# Patient Record
Sex: Female | Born: 1980 | Race: White | Hispanic: No | Marital: Married | Smoking: Never smoker
Health system: Southern US, Community
[De-identification: ages and names within clinical notes are randomized; demographics above are authoritative.]

## PROBLEM LIST (undated history)

## (undated) ENCOUNTER — Ambulatory Visit: Payer: 59

## (undated) DIAGNOSIS — E282 Polycystic ovarian syndrome: Secondary | ICD-10-CM

## (undated) DIAGNOSIS — K760 Fatty (change of) liver, not elsewhere classified: Secondary | ICD-10-CM

## (undated) DIAGNOSIS — F32A Depression, unspecified: Secondary | ICD-10-CM

## (undated) DIAGNOSIS — T7840XA Allergy, unspecified, initial encounter: Secondary | ICD-10-CM

## (undated) DIAGNOSIS — E785 Hyperlipidemia, unspecified: Secondary | ICD-10-CM

## (undated) DIAGNOSIS — E538 Deficiency of other specified B group vitamins: Secondary | ICD-10-CM

## (undated) DIAGNOSIS — E559 Vitamin D deficiency, unspecified: Secondary | ICD-10-CM

## (undated) DIAGNOSIS — I10 Essential (primary) hypertension: Secondary | ICD-10-CM

## (undated) DIAGNOSIS — E119 Type 2 diabetes mellitus without complications: Secondary | ICD-10-CM

## (undated) DIAGNOSIS — M2550 Pain in unspecified joint: Secondary | ICD-10-CM

## (undated) HISTORY — DX: Hyperlipidemia, unspecified: E78.5

## (undated) HISTORY — DX: Depression, unspecified: F32.A

## (undated) HISTORY — DX: Deficiency of other specified B group vitamins: E53.8

## (undated) HISTORY — DX: Type 2 diabetes mellitus without complications: E11.9

## (undated) HISTORY — DX: Polycystic ovarian syndrome: E28.2

## (undated) HISTORY — DX: Fatty (change of) liver, not elsewhere classified: K76.0

## (undated) HISTORY — DX: Pain in unspecified joint: M25.50

## (undated) HISTORY — DX: Allergy, unspecified, initial encounter: T78.40XA

## (undated) HISTORY — DX: Vitamin D deficiency, unspecified: E55.9

---

## 1998-08-24 HISTORY — PX: FOOT SURGERY: SHX648

## 2006-11-29 ENCOUNTER — Encounter: Payer: Self-pay | Admitting: Maternal & Fetal Medicine

## 2007-01-03 ENCOUNTER — Observation Stay: Payer: Self-pay

## 2007-02-17 ENCOUNTER — Inpatient Hospital Stay: Payer: Self-pay

## 2007-02-21 ENCOUNTER — Inpatient Hospital Stay: Payer: Self-pay

## 2007-03-07 ENCOUNTER — Ambulatory Visit: Payer: Self-pay | Admitting: Pediatrics

## 2007-09-26 ENCOUNTER — Ambulatory Visit: Payer: Self-pay | Admitting: Family Medicine

## 2007-11-04 ENCOUNTER — Ambulatory Visit: Payer: Self-pay | Admitting: Family Medicine

## 2008-05-07 IMAGING — CR DG CHEST 2V
1 series · 2 of 2 positions shown · non-contrast
Comparison: none

REASON FOR EXAM: pneumonia
COMMENTS:

[Series 1: view not recorded · 0.17mm/px · 2 of 2 slices shown]
[im 1/2]
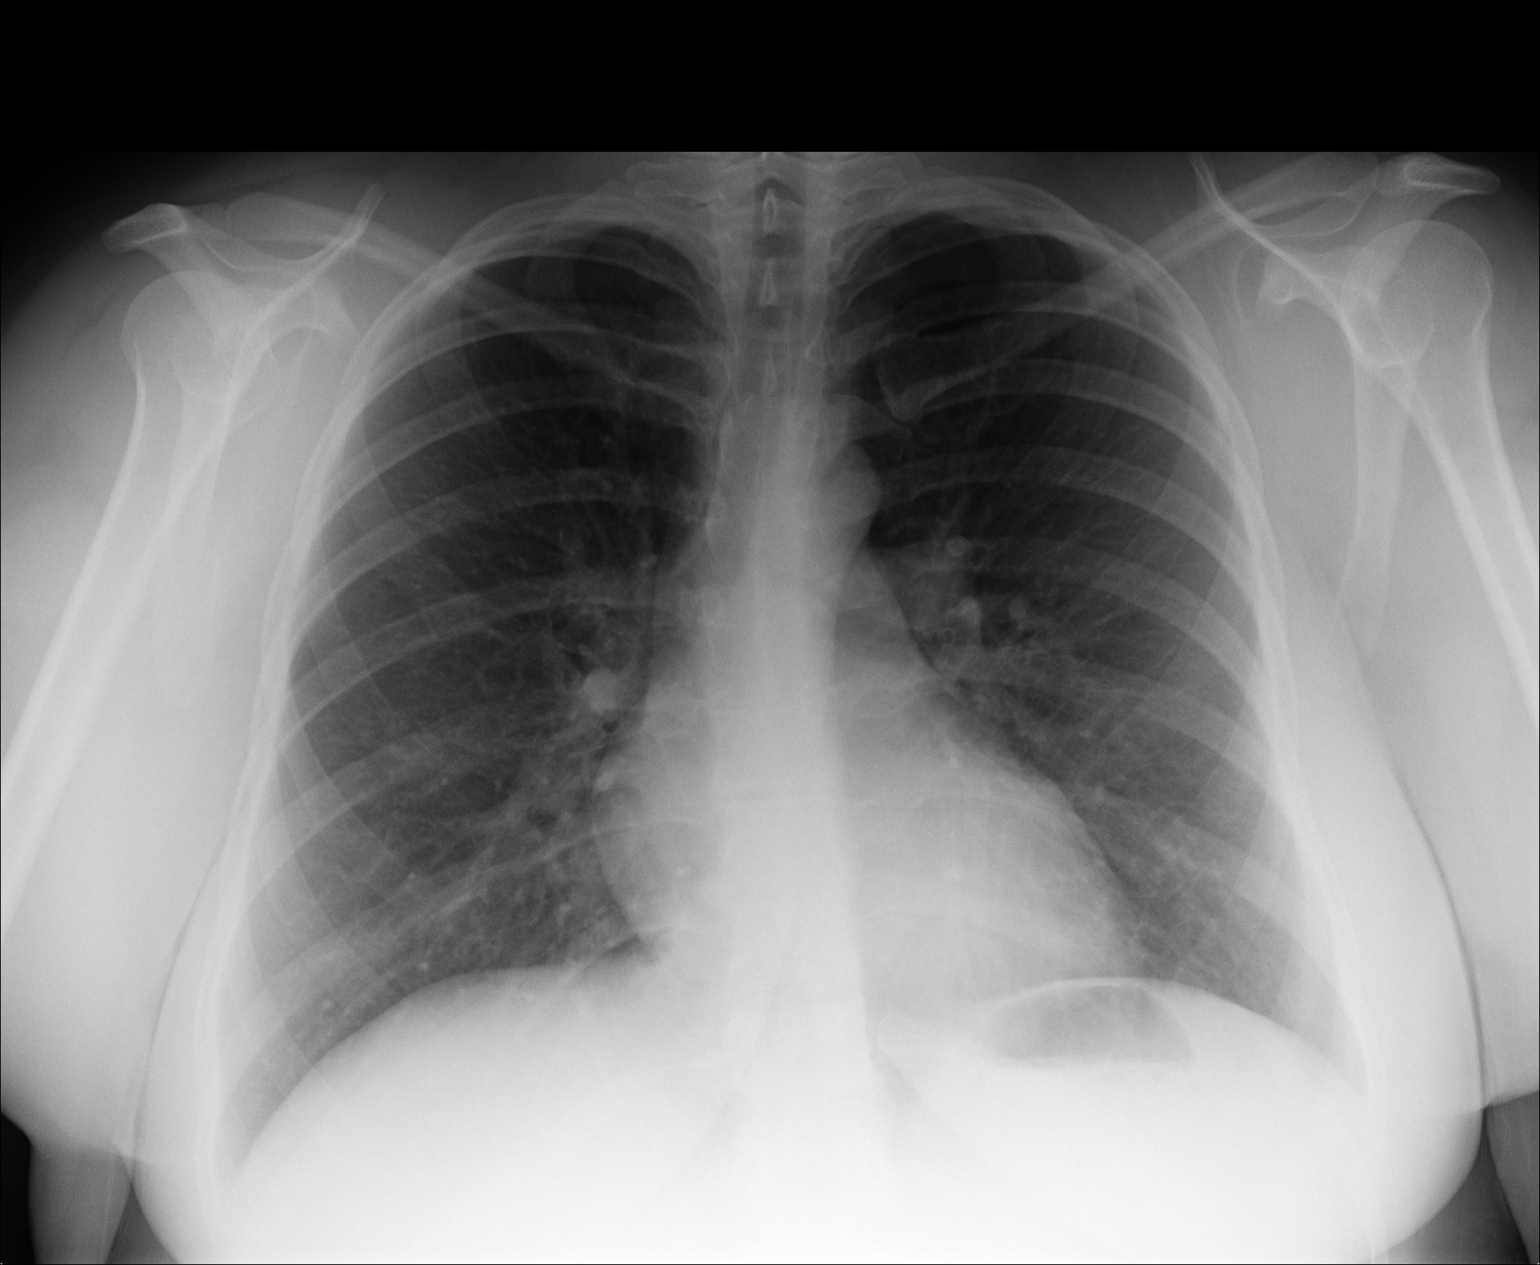
[im 2/2]
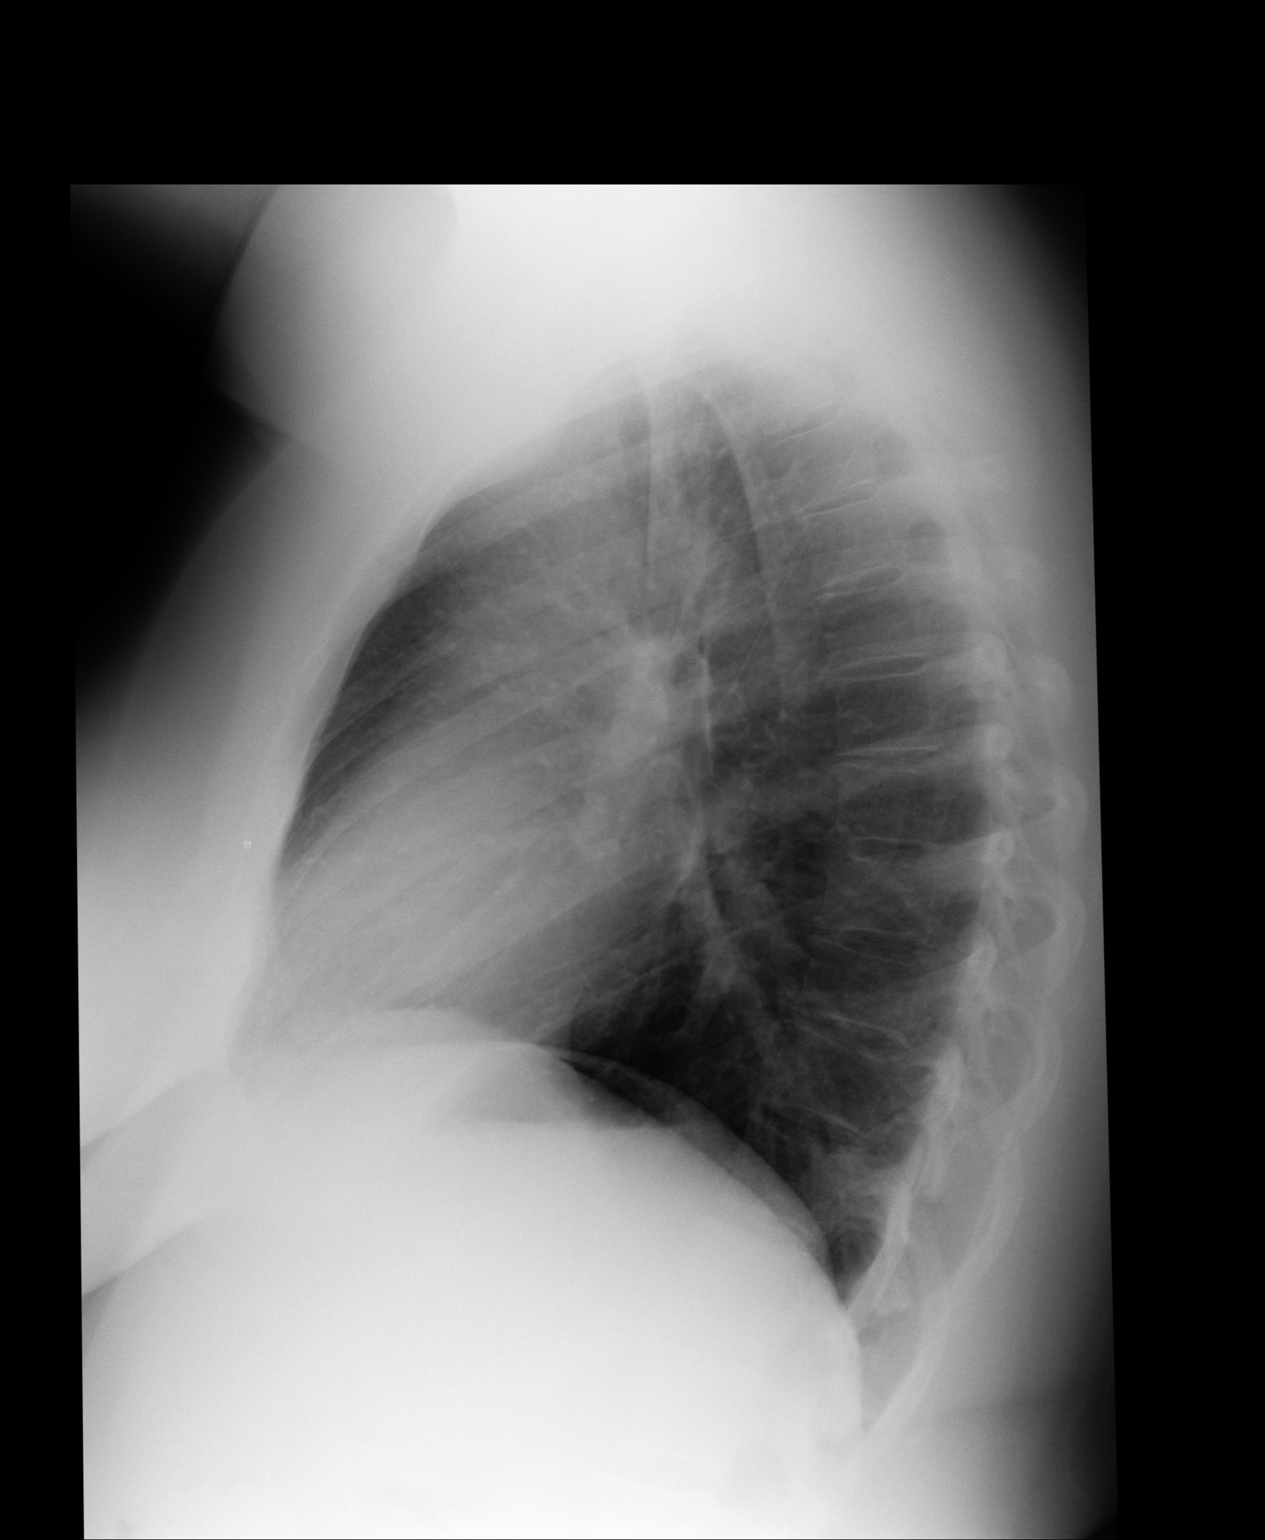

[2 of 2 positions shown; findings below may reference images not displayed]

PROCEDURE:     KDR - KDXR CHEST PA (OR AP) AND LAT  - November 04, 2007 [DATE]

RESULT:     Comparison is made to the prior exam of 09/26/2007. The previously
noted LEFT perihilar infiltrate is no longer seen. The current exam shows
the lung fields to be clear. Heart size is normal. The mediastinal and
osseous structures show no significant abnormalities.
IMPRESSION: 1. No significant abnormalities are noted.
2. The previously noted LEFT perihilar infiltrate has cleared.

## 2008-07-18 ENCOUNTER — Observation Stay: Payer: Self-pay

## 2008-07-27 ENCOUNTER — Observation Stay: Payer: Self-pay

## 2008-08-08 ENCOUNTER — Ambulatory Visit: Payer: Self-pay | Admitting: Obstetrics & Gynecology

## 2008-08-09 ENCOUNTER — Inpatient Hospital Stay: Payer: Self-pay | Admitting: Obstetrics & Gynecology

## 2014-01-22 ENCOUNTER — Ambulatory Visit: Payer: Self-pay | Admitting: Obstetrics and Gynecology

## 2015-02-22 ENCOUNTER — Other Ambulatory Visit: Payer: Self-pay | Admitting: Emergency Medicine

## 2015-02-22 ENCOUNTER — Encounter: Payer: Self-pay | Admitting: Family Medicine

## 2015-02-22 ENCOUNTER — Ambulatory Visit (INDEPENDENT_AMBULATORY_CARE_PROVIDER_SITE_OTHER): Payer: BLUE CROSS/BLUE SHIELD | Admitting: Family Medicine

## 2015-02-22 ENCOUNTER — Encounter: Payer: Self-pay | Admitting: Emergency Medicine

## 2015-02-22 VITALS — BP 118/70 | HR 82 | Temp 98.1°F | Resp 16 | Ht 64.0 in | Wt 268.0 lb

## 2015-02-22 DIAGNOSIS — J309 Allergic rhinitis, unspecified: Secondary | ICD-10-CM | POA: Insufficient documentation

## 2015-02-22 DIAGNOSIS — H6981 Other specified disorders of Eustachian tube, right ear: Secondary | ICD-10-CM | POA: Diagnosis not present

## 2015-02-22 NOTE — Progress Notes (Signed)
Subjective:     Patient ID: Anita Webb, female   DOB: 1980-11-30, 34 y.o.   MRN: [id number]  HPI  Chief Complaint  Patient presents with  . Ear Fullness  States she is completing a 10 day course of Augmentin for an ear infection per Urgent Care.States her ear is no longer painful but she has persistent ear fullness and crackling. Returned to Urgent Care and was told to use allergy medication. States she has started otc Zyrtec with modest improvement.  Review of Systems  Constitutional: Negative for fever and chills.       Objective:   Physical Exam  Constitutional: She appears well-developed and well-nourished. No distress.  HENT:  Left TM without inflammation without distortion of landmarks. Right TM with mild distortion of light reflex       Assessment:    1. Eustachian tube dysfunction, right     Plan:    Resume steroid nasal spray and add decongestants.

## 2015-02-22 NOTE — Patient Instructions (Signed)
Resume steroid nasal spray and try decongestants for your ear.

## 2015-04-10 ENCOUNTER — Other Ambulatory Visit: Payer: Self-pay | Admitting: Family Medicine

## 2015-04-10 NOTE — Telephone Encounter (Signed)
Refill order request

## 2015-04-10 NOTE — Telephone Encounter (Signed)
Pt stated that she has been getting losartan-hydrochlorothiazide (HYZAAR) 50-12.5 MG per tablet from [facility] but since they are no longer seeing pts for general care they can no longer write this for pt. Pt is requesting that Nadine Counts take over this medication and send an RX to [facility]. Thanks TNP

## 2015-04-11 ENCOUNTER — Other Ambulatory Visit: Payer: Self-pay | Admitting: Family Medicine

## 2015-04-11 DIAGNOSIS — I10 Essential (primary) hypertension: Secondary | ICD-10-CM

## 2015-04-11 MED ORDER — LOSARTAN POTASSIUM-HCTZ 50-12.5 MG PO TABS: 1.0000 | ORAL_TABLET | Freq: Every day | ORAL | Status: DC

## 2015-04-12 ENCOUNTER — Other Ambulatory Visit: Payer: Self-pay | Admitting: Family Medicine

## 2015-04-12 NOTE — Telephone Encounter (Signed)
Patient has been advised. KW 

## 2015-04-12 NOTE — Telephone Encounter (Signed)
PT called to see if this could be refilled or if she needs an OV. Thanks TNP

## 2015-04-12 NOTE — Telephone Encounter (Signed)
This was sent in yesterday

## 2015-06-27 ENCOUNTER — Other Ambulatory Visit: Payer: Self-pay | Admitting: Family Medicine

## 2015-06-27 DIAGNOSIS — I10 Essential (primary) hypertension: Secondary | ICD-10-CM

## 2015-06-27 MED ORDER — LOSARTAN POTASSIUM-HCTZ 50-12.5 MG PO TABS: 1.0000 | ORAL_TABLET | Freq: Every day | ORAL | Status: DC

## 2016-06-05 ENCOUNTER — Other Ambulatory Visit: Payer: Self-pay | Admitting: Family Medicine

## 2016-06-05 DIAGNOSIS — I10 Essential (primary) hypertension: Secondary | ICD-10-CM

## 2016-06-05 NOTE — Telephone Encounter (Signed)
Physician is out of office can you please review. Thanks [employer]

## 2016-08-07 ENCOUNTER — Encounter: Payer: Self-pay | Admitting: Emergency Medicine

## 2016-08-07 ENCOUNTER — Emergency Department
Admission: EM | Admit: 2016-08-07 | Discharge: 2016-08-07 | Disposition: A | Discharge status: Home / Self Care | Payer: BLUE CROSS/BLUE SHIELD | Attending: Emergency Medicine | Admitting: Emergency Medicine

## 2016-08-07 DIAGNOSIS — J36 Peritonsillar abscess: Secondary | ICD-10-CM | POA: Insufficient documentation

## 2016-08-07 DIAGNOSIS — I10 Essential (primary) hypertension: Secondary | ICD-10-CM | POA: Diagnosis not present

## 2016-08-07 DIAGNOSIS — R220 Localized swelling, mass and lump, head: Secondary | ICD-10-CM | POA: Diagnosis present

## 2016-08-07 HISTORY — DX: Essential (primary) hypertension: I10

## 2016-08-07 LAB — CBC WITH DIFFERENTIAL/PLATELET
BASOS PCT: 0 %
Basophils Absolute: 0.1 10*3/uL (ref 0–0.1)
EOS ABS: 0 10*3/uL (ref 0–0.7)
EOS PCT: 0 %
HCT: 41 % (ref 35.0–47.0)
Hemoglobin: 14 g/dL (ref 12.0–16.0)
LYMPHS ABS: 1.2 10*3/uL (ref 1.0–3.6)
Lymphocytes Relative: 9 %
MCH: 28.5 pg (ref 26.0–34.0)
MCHC: 34.1 g/dL (ref 32.0–36.0)
MCV: 83.7 fL (ref 80.0–100.0)
Monocytes Absolute: 1.2 10*3/uL — ABNORMAL HIGH (ref 0.2–0.9)
Monocytes Relative: 9 %
Neutro Abs: 11.6 10*3/uL — ABNORMAL HIGH (ref 1.4–6.5)
Neutrophils Relative %: 82 %
PLATELETS: 243 10*3/uL (ref 150–440)
RBC: 4.9 MIL/uL (ref 3.80–5.20)
RDW: 12.9 % (ref 11.5–14.5)
WBC: 14.2 10*3/uL — AB (ref 3.6–11.0)

## 2016-08-07 LAB — POCT PREGNANCY, URINE: Preg Test, Ur: NEGATIVE

## 2016-08-07 LAB — BASIC METABOLIC PANEL
Anion gap: 8 (ref 5–15)
BUN: 11 mg/dL (ref 6–20)
CALCIUM: 8.9 mg/dL (ref 8.9–10.3)
CO2: 26 mmol/L (ref 22–32)
CREATININE: 0.86 mg/dL (ref 0.44–1.00)
Chloride: 99 mmol/L — ABNORMAL LOW (ref 101–111)
GFR calc Af Amer: >60 mL/min (ref >60)
Glucose, Bld: 121 mg/dL — ABNORMAL HIGH (ref 65–99)
Potassium: 3.6 mmol/L (ref 3.5–5.1)
SODIUM: 133 mmol/L — AB (ref 135–145)

## 2016-08-07 LAB — POCT RAPID STREP A: Streptococcus, Group A Screen (Direct): NEGATIVE

## 2016-08-07 MED ORDER — OXYCODONE-ACETAMINOPHEN 5-325 MG PO TABS
1.0000 | ORAL_TABLET | Freq: Once | ORAL | Status: AC
  Administered 2016-08-07: 1 via ORAL

## 2016-08-07 MED ORDER — DEXAMETHASONE SODIUM PHOSPHATE 4 MG/ML IJ SOLN
10.0000 mg | Freq: Once | INTRAMUSCULAR | Status: AC
Start: 2016-08-07 — End: 2016-08-07
  Administered 2016-08-07: 10 mg via INTRAVENOUS

## 2016-08-07 MED ORDER — OXYCODONE-ACETAMINOPHEN 5-325 MG PO TABS: 1.0000 | ORAL_TABLET | ORAL | 0 refills | Status: DC | PRN

## 2016-08-07 MED ORDER — AMOXICILLIN-POT CLAVULANATE 875-125 MG PO TABS: 1.0000 | ORAL_TABLET | Freq: Two times a day (BID) | ORAL | 0 refills | Status: AC

## 2016-08-07 MED ORDER — PREDNISONE 10 MG PO TABS: ORAL_TABLET | ORAL | 0 refills | Status: DC

## 2016-08-07 MED ORDER — OXYCODONE-ACETAMINOPHEN 5-325 MG PO TABS
ORAL_TABLET | ORAL | Status: DC
Start: 2016-08-07 — End: 2016-08-07
  Filled 2016-08-07: qty 1

## 2016-08-07 MED ORDER — DEXAMETHASONE SODIUM PHOSPHATE 4 MG/ML IJ SOLN
INTRAMUSCULAR | Status: AC
  Filled 2016-08-07: qty 3

## 2016-08-07 MED ORDER — SODIUM CHLORIDE 0.9 % IV SOLN
3.0000 g | Freq: Once | INTRAVENOUS | Status: AC
  Administered 2016-08-07: 3 g via INTRAVENOUS
  Filled 2016-08-07: qty 3

## 2016-08-07 NOTE — ED Provider Notes (Signed)
[facility] Emergency Department Provider Note  ____________________________________________   First MD Initiated Contact with Patient 08/07/16 1018     (approximate)  I have reviewed the triage vital signs and the nursing notes.   HISTORY  Chief Complaint Facial Swelling   HPI Anita Webb is a 35 y.o. female is here complaining of sore throat that began 2 days ago but has gotten worse since last night. Patient states that she had a temperature of 102 but has been taking Tylenol and ibuprofen to control this. [facility] and was sent to the emergency room. Patient continues to eat and drink and swallow her own saliva. Patient currently also complains of left ear pain. Patient denies any known exposure to strep throat. Currently she rates her pain as a 5/10.   Past Medical History:  Diagnosis Date  . Hypertension     Patient Active Problem List   Diagnosis Date Noted  . Allergic rhinitis 02/22/2015  . Adiposity 02/22/2015    Past Surgical History:  Procedure Laterality Date  . CESAREAN SECTION  02/2007  . FOOT SURGERY  2000   bone removed from left heel     Prior to Admission medications   Medication Sig Start Date End Date Taking? Authorizing Provider  amoxicillin-clavulanate (AUGMENTIN) 875-125 MG tablet Take 1 tablet by mouth 2 (two) times daily. 08/07/16 08/14/16  Tommi Rumps, PA-C  fluticasone (FLONASE) 50 MCG/ACT nasal spray Place into the nose. 12/12/12   Historical Provider, MD  losartan-hydrochlorothiazide (HYZAAR) 50-12.5 MG tablet TAKE 1 TABLET DAILY 06/05/16   Malva Limes, MD  MULTIPLE VITAMINS PO Take by mouth.    Historical Provider, MD  oxyCODONE-acetaminophen (PERCOCET) 5-325 MG tablet Take 1 tablet by mouth every 4 (four) hours as needed for severe pain. 08/07/16   Tommi Rumps, PA-C  predniSONE (DELTASONE) 10 MG tablet Take 3 tablets once a day for 3 days starting tomorrow 08/07/16    Tommi Rumps, PA-C    Allergies Erythromycin  Family History  Problem Relation Age of Onset  . Hyperlipidemia Mother   . Hypertension Mother   . Dementia Mother   . Cancer Maternal Grandmother     breast CA  . Diabetes Maternal Grandmother   . Hypertension Maternal Grandfather   . Heart disease Paternal Grandmother   . Hypertension Paternal Grandmother     Social History Social History  Substance Use Topics  . Smoking status: Never Smoker  . Smokeless tobacco: Never Used  . Alcohol use Yes     Comment: 1-2 drink per week    Review of Systems Constitutional: Positive fever/negative chills ENT: Positive sore throat. Positive left ear pain. Cardiovascular: Denies chest pain. Respiratory: Denies shortness of breath. Gastrointestinal: No abdominal pain.  No nausea, no vomiting.   Musculoskeletal: Negative for muscle or joint pain. Skin: Negative for rash. Neurological: Negative for headaches, focal weakness or numbness.  10-point ROS otherwise negative.  ____________________________________________   PHYSICAL EXAM:  VITAL SIGNS: ED Triage Vitals  Enc Vitals Group     BP 08/07/16 1011 139/80     Pulse Rate 08/07/16 1011 (!) 115     Resp 08/07/16 1011 20     Temp 08/07/16 1011 98.7 F (37.1 C)     Temp Source 08/07/16 1011 Oral     SpO2 08/07/16 1011 98 %     Weight 08/07/16 1011 265 lb (120.2 kg)     Height 08/07/16 1011 5\' 4"  (  1.626 m)     Head Circumference --      Peak Flow --      Pain Score 08/07/16 1012 5     Pain Loc --      Pain Edu? --      Excl. in GC? --     Constitutional: Alert and oriented. Well appearing and in no acute distress. Eyes: Conjunctivae are normal. PERRL. EOMI. Head: Atraumatic. Nose: No congestion/rhinnorhea.  Bilateral EACs and TMs are clear. Mouth/Throat: Mucous membranes are moist.  Oropharynx erythematous with the left greater than the right. Uvula is still midline. Moderate amount of erythema is present on the left.  Patient continues to speak in complete sentences. She is able to swallow her own saliva without any difficulty. Neck: No stridor.   Hematological/Lymphatic/Immunilogical: Minimal cervical lymphadenopathy. Cardiovascular: Normal rate, regular rhythm. Grossly normal heart sounds.  Good peripheral circulation. Respiratory: Normal respiratory effort.  No retractions. Lungs CTAB. Musculoskeletal: Moves upper and lower extremities without any difficulty. Normal gait was noted. Neurologic:  Normal speech and language. No gross focal neurologic deficits are appreciated. No gait instability. Skin:  Skin is warm, dry and intact. No rash noted. Psychiatric: Mood and affect are normal. Speech and behavior are normal.  ____________________________________________   LABS (all labs ordered are listed, but only abnormal results are displayed)  Labs Reviewed  BASIC METABOLIC PANEL - Abnormal; Notable for the following:       Result Value   Sodium 133 (*)    Chloride 99 (*)    Glucose, Bld 121 (*)    All other components within normal limits  CBC WITH DIFFERENTIAL/PLATELET - Abnormal; Notable for the following:    WBC 14.2 (*)    Neutro Abs 11.6 (*)    Monocytes Absolute 1.2 (*)    All other components within normal limits  POC URINE PREG, ED  POCT PREGNANCY, URINE  POCT RAPID STREP A     PROCEDURES  Procedure(s) performed: None  Procedures  Critical Care performed: No  ____________________________________________   INITIAL IMPRESSION / ASSESSMENT AND PLAN / ED COURSE  Pertinent labs & imaging results that were available during my care of the patient were reviewed by me and considered in my medical decision making (see chart for details).    Clinical Course    ----------------------------------------- 1:36 PM on 08/07/2016 ----------------------------------------- Patient is up to the bathroom, ambulatory without assistance. Patient states that her throat is feeling much better.  Patient continues to swallow her own saliva and having no difficulty with speaking.  Patient was given Unasyn IV 3 g and Decadron 10 mg IV. Patient is continue with Augmentin 875 twice a day for 10 days, prednisone 30 mg daily for 3 days and Percocet as needed for pain. Patient is encouraged to drink fluids. She'll call make an appointment with Dr. Andee PolesVaught. She is aware that if she begins with inability to swallow her saliva she is to return to the emergency room. Patient was discharged from the emergency room feeling much better.   ____________________________________________   FINAL CLINICAL IMPRESSION(S) / ED DIAGNOSES  Final diagnoses:  Peritonsillar cellulitis      NEW MEDICATIONS STARTED DURING THIS VISIT:  Discharge Medication List as of 08/07/2016  1:57 PM    START taking these medications   Details  oxyCODONE-acetaminophen (PERCOCET) 5-325 MG tablet Take 1 tablet by mouth every 4 (four) hours as needed for severe pain., Starting Fri 08/07/2016, Print    predniSONE (DELTASONE) 10 MG tablet Take 3  tablets once a day for 3 days starting tomorrow, Print         Note:  This document was prepared using Dragon voice recognition software and may include unintentional dictation errors.    Tommi RumpsRhonda L Yuan Gann, PA-C 08/07/16 1636    Nita Sicklearolina Veronese, MD 08/08/16 [phone]

## 2016-08-07 NOTE — Discharge Instructions (Signed)
Call to make an appointment with  ENT for Monday. Take Percocet as needed for pain. Begin prednisone tomorrow. Take Augmentin twice a day for 10 days beginning tonight. Increase fluids. Return to the emergency room if any severe worsening of your symptoms or inability to swallow your own saliva.

## 2016-08-07 NOTE — ED Triage Notes (Signed)
[facility] with  Sore throat  Swelling to throat

## 2016-08-07 NOTE — ED Notes (Signed)
Pt up to restroom.

## 2016-09-28 ENCOUNTER — Other Ambulatory Visit: Payer: Self-pay | Admitting: Family Medicine

## 2016-09-28 NOTE — Telephone Encounter (Signed)
Pt needs new rx refill for    losartan-hydrochlorothiazide (HYZAAR) 50-12.5 MG tablet   06/05/16 -- Malva Limesonald E Fisher, MD   TAKE 1 TABLET DAILY   She is currently [facility]  Thanks Barth Kirkseri

## 2016-09-28 NOTE — Telephone Encounter (Signed)
Patient has been advised. KW 

## 2016-09-28 NOTE — Telephone Encounter (Signed)
She last received 30 pills in October of last year. Will give her a month's worth but she must come in before they are gone for an office visit.

## 2016-09-28 NOTE — Telephone Encounter (Signed)
Its been a year since patient was last in office to address Hypertension do you want her to make appt before any further refills? Please review chart and advise. KW

## 2016-09-29 ENCOUNTER — Other Ambulatory Visit: Payer: Self-pay | Admitting: Family Medicine

## 2016-09-29 ENCOUNTER — Telehealth: Payer: Self-pay

## 2016-09-29 DIAGNOSIS — I10 Essential (primary) hypertension: Secondary | ICD-10-CM

## 2016-09-29 MED ORDER — LOSARTAN POTASSIUM-HCTZ 50-12.5 MG PO TABS: 1.0000 | ORAL_TABLET | Freq: Every day | ORAL | 0 refills | Status: DC

## 2016-09-29 NOTE — Telephone Encounter (Signed)
Bp medication sent in pending office visit

## 2016-09-29 NOTE — Telephone Encounter (Signed)
Please see previous message from yesterday, patient reports that Losartan-HCTZ was never [facility] and your message the other day stated that you would be sending in one month supply. Patient arranging follow up appt this week. KW

## 2016-10-28 ENCOUNTER — Telehealth: Payer: Self-pay

## 2016-10-28 NOTE — Telephone Encounter (Signed)
Pt states she has requested an appt for today due to heavy menstrual bleeding. She is on a wait list to see if anyone cancels. Pt c/o heavy bleeding through super tampon every 45 mins to an hour with clots. Please advise.

## 2016-10-28 NOTE — Telephone Encounter (Signed)
Pt takes provera Q3 months for menses. She took provera as normal this month and menses started 10/24/16. Started out with normal flow but became heavy last night, changing super tampons Q45 min to 3 hrs. Sx continued this AM but are now improving. Pt has slightly worse dysmen but nothing substantial. Reassurance. F/u if sx worsen again. Pt has annual 4/18.

## 2016-10-30 ENCOUNTER — Ambulatory Visit: Payer: Self-pay | Admitting: Obstetrics & Gynecology

## 2016-11-03 ENCOUNTER — Other Ambulatory Visit: Payer: Self-pay | Admitting: Family Medicine

## 2016-11-03 DIAGNOSIS — I10 Essential (primary) hypertension: Secondary | ICD-10-CM

## 2016-12-07 ENCOUNTER — Ambulatory Visit: Payer: Self-pay | Admitting: Obstetrics and Gynecology

## 2017-01-11 ENCOUNTER — Ambulatory Visit: Payer: Self-pay | Admitting: Obstetrics and Gynecology

## 2017-01-13 ENCOUNTER — Ambulatory Visit (INDEPENDENT_AMBULATORY_CARE_PROVIDER_SITE_OTHER): Payer: 59 | Admitting: Family Medicine

## 2017-01-13 ENCOUNTER — Encounter: Payer: Self-pay | Admitting: Family Medicine

## 2017-01-13 VITALS — BP 110/60 | HR 110 | Temp 98.5°F | Resp 17 | Wt 288.8 lb

## 2017-01-13 DIAGNOSIS — S91209A Unspecified open wound of unspecified toe(s) with damage to nail, initial encounter: Secondary | ICD-10-CM

## 2017-01-13 NOTE — Progress Notes (Signed)
Subjective:     Patient ID: Anita BrookesGina L Webb, female   DOB: October 12, 1980, 36 y.o.   MRN: [id number]  HPI  Chief Complaint  Patient presents with  . Toe Injury    Patient comes in office today with complaints of pain to her great toe on right foot. Patient states this morning she was wearing sandles and her son accidently stepped on her feet.   States her son weight 60# and her nail was pulled up.   Review of Systems     Objective:   Physical Exam  Constitutional: She appears well-developed and well-nourished. She appears distressed (mild due to discomfort and anxiety).  Skin:  Right first toe nail has been partially avulsed but is intact at the base.       Assessment:    1. Nail avulsion, toe, initial encounter     Plan:   Discussed salt water soaks and application of dressing to prevent further trauma to the nail.

## 2017-01-13 NOTE — Patient Instructions (Signed)
Warm salt water soaks once or twice daily then apply dressing/bandaid. If nail partially comes off-it may pull off after soaks or we can cut it off.

## 2017-02-16 ENCOUNTER — Ambulatory Visit: Payer: Self-pay | Admitting: Obstetrics and Gynecology

## 2017-04-28 ENCOUNTER — Ambulatory Visit: Payer: 59 | Admitting: Family Medicine

## 2018-06-23 NOTE — Progress Notes (Signed)
Anita Webb is a 37 y.o. female is here to establish care.  History of Present Illness:   HPI: See Assessment and Plan section for Problem Based Charting of issues discussed today.   Health Maintenance Due  Topic Date Due  . HIV Screening  06/15/1994  . PAP SMEAR  06/15/2000   No flowsheet data found.   PMHx, SurgHx, SocialHx, FamHx, Medications, and Allergies were reviewed in the Visit Navigator and updated as appropriate.   Patient Active Problem List   Diagnosis Date Noted  . Primary insomnia 06/25/2018  . Hirsutism 06/25/2018  . Amenorrhea 06/25/2018  . Essential hypertension 06/25/2018  . Insulin resistance 06/25/2018  . Pure hypercholesterolemia 06/25/2018  . Vitamin D deficiency 06/25/2018  . Allergic rhinitis 02/22/2015  . Morbid obesity (HCC) 02/22/2015   Social History   Tobacco Use  . Smoking status: Never Smoker  . Smokeless tobacco: Never Used  Substance Use Topics  . Alcohol use: Yes    Comment: 1-2 drink per week  . Drug use: No   Current Medications and Allergies:   .  magnesium 30 MG tablet, Take 30 mg by mouth 2 (two) times daily., Disp: , Rfl:     Allergies  Allergen Reactions  . Erythromycin     GI upset   Review of Systems   Pertinent items are noted in the HPI. Otherwise, ROS is negative.  Vitals:   Vitals:   06/24/18 0739 06/24/18 0831  BP: (!) 168/100 (!) 158/88  Pulse: 89   Resp: 16   Temp: 98 F (36.7 C)   TempSrc: Oral   SpO2: 98%   Weight: 297 lb (134.7 kg)   Height: 5\' 4"  (1.626 m)      Body mass index is 50.98 kg/m.  Physical Exam:   Physical Exam  Constitutional: She appears well-nourished.  Central adiposity.  HENT:  Head: Normocephalic and atraumatic.  Eyes: Pupils are equal, round, and reactive to light. EOM are normal.  Neck: Normal range of motion. Neck supple.  Cardiovascular: Normal rate, regular rhythm, normal heart sounds and intact distal pulses.  Pulmonary/Chest: Effort normal.  Abdominal:  Soft.  Skin: Skin is warm.  Facial hair.  Psychiatric: She has a normal mood and affect. Her behavior is normal.  Nursing note and vitals reviewed.  Results for orders placed or performed in visit on 06/24/18  CBC with Differential/Platelet  Result Value Ref Range   WBC 5.9 4.0 - 10.5 K/uL   RBC 5.07 3.87 - 5.11 Mil/uL   Hemoglobin 14.7 12.0 - 15.0 g/dL   HCT 16.1 09.6 - 04.5 %   MCV 85.7 78.0 - 100.0 fl   MCHC 33.9 30.0 - 36.0 g/dL   RDW 40.9 81.1 - 91.4 %   Platelets 230.0 150.0 - 400.0 K/uL   Neutrophils Relative % 56.1 43.0 - 77.0 %   Lymphocytes Relative 32.9 12.0 - 46.0 %   Monocytes Relative 8.2 3.0 - 12.0 %   Eosinophils Relative 2.0 0.0 - 5.0 %   Basophils Relative 0.8 0.0 - 3.0 %   Neutro Abs 3.3 1.4 - 7.7 K/uL   Lymphs Abs 1.9 0.7 - 4.0 K/uL   Monocytes Absolute 0.5 0.1 - 1.0 K/uL   Eosinophils Absolute 0.1 0.0 - 0.7 K/uL   Basophils Absolute 0.0 0.0 - 0.1 K/uL  Comprehensive metabolic panel  Result Value Ref Range   Sodium 138 135 - 145 mEq/L   Potassium 3.5 3.5 - 5.1 mEq/L   Chloride 101 96 -  112 mEq/L   CO2 24 19 - 32 mEq/L   Glucose, Bld 96 70 - 99 mg/dL   BUN 12 6 - 23 mg/dL   Creatinine, Ser 1.61 0.40 - 1.20 mg/dL   Total Bilirubin 0.5 0.2 - 1.2 mg/dL   Alkaline Phosphatase 59 39 - 117 U/L   AST 34 0 - 37 U/L   ALT 31 0 - 35 U/L   Total Protein 7.5 6.0 - 8.3 g/dL   Albumin 4.6 3.5 - 5.2 g/dL   Calcium 9.3 8.4 - 09.6 mg/dL   GFR 04.54 >09.81 mL/min  Hemoglobin A1c  Result Value Ref Range   Hgb A1c MFr Bld 6.1 4.6 - 6.5 %  Lipid panel  Result Value Ref Range   Cholesterol 185 0 - 200 mg/dL   Triglycerides 191.4 (H) 0.0 - 149.0 mg/dL   HDL 78.29 (L) >56.21 mg/dL   VLDL 30.8 (H) 0.0 - 65.7 mg/dL   Total CHOL/HDL Ratio 5    NonHDL 149.90   TSH  Result Value Ref Range   TSH 3.45 0.35 - 4.50 uIU/mL  VITAMIN D 25 Hydroxy (Vit-D Deficiency, Fractures)  Result Value Ref Range   VITD 16.89 (L) 30.00 - 100.00 ng/mL  B-HCG Quant  Result Value Ref  Range   Quantitative HCG 0.47 mIU/ml  LDL cholesterol, direct  Result Value Ref Range   Direct LDL 125.0 mg/dL   Assessment and Plan:   Vitamin D deficiency Anita Webb was informed that low vitamin D levels contributes to fatigue and are associated with obesity, breast, and colon cancer. Current vitamin D level is 16.89. Goal vitamin D level > 40. She will supplement as below and recheck in 3-6 months.  [x]   Vitamin D 50,000 IU x 12 weeks, then 2000 IU daily. []   2000 IU daily.    Primary insomnia Patient complains of early morning awakening, frequent night time awakening and difficulty falling asleep for the past 1 year.  Associated symptoms include daytime somnolence and fatigue. Average duration of sleep 6 hours/night with a few awakenings/night. The patient has been taking: Benadryl. Side effects from the medication: sedation.   Plan: Discussed sleep hygiene measures including regular sleep schedule, optimal sleep environment, and relaxing presleep rituals. Avoid daytime naps. Avoid caffeine after noon. Avoid excess alcohol. Avoid tobacco. Recommended daily exercise.  Prescription provided today: Trazdone.   Morbid obesity (HCC) Anita Webb is a 37 y.o. female here for discussion regarding weight loss. History of eating disorders: none. There is a family history positive for obesity in the patient. Previous treatments for obesity include self-directed dieting. Obesity associated medical conditions: amenorrhea, hirsutism, hyperlipidemia and hypertension. Obesity associated medications: none.   Contraindications to weight loss: none. Patient readiness to commit to diet and activity changes: excellent. Barriers to weight loss: none.  Obesity. I assessed Anita Webb to be in an action stage with respect to weight loss.   Plan: 1. Diagnostic studies to rule out secondary causes of obesity: see below. 2. General patient education:   Importance of long-term maintenance treatment in weight  loss.  Use non-food self-rewards to reinforce behavior changes.  Elicit support from others; identify saboteurs. 3. Diet interventions:   Risks of dieting were reviewed, including fatigue, temporary hair loss, gallstone formation, gout, and with very low calorie diets, electrolyte abnormalities, nutrient inadequacies, and loss of lean body mass. 4. Exercise intervention:   Informal measures, e.g. taking stairs instead of elevator.  Formal exercise regimen options discussed as well. 5. Other behavioral treatment:  stress management. 6. Other treatment: Medication: Metformin. 7. Patient to keep a weight log that we will review at follow up. 8. Follow up: 3 months and as needed.  Pure hypercholesterolemia Lipids:    Component Value Date/Time   CHOL 185 06/24/2018 0810   TRIG 304.0 (H) 06/24/2018 0810   HDL 35.20 (L) 06/24/2018 0810   LDLDIRECT 125.0 06/24/2018 0810   VLDL 60.8 (H) 06/24/2018 0810   CHOLHDL 5 06/24/2018 0810   The patient does not use medications that may worsen dyslipidemias (corticosteroids, progestins, anabolic steroids, diuretics, beta-blockers, amiodarone, cyclosporine, olanzapine). The patient exercises intermittently. The patient is known to have coexisting coronary artery disease.   Lab Review Lab Results  Component Value Date   CHOL 185 06/24/2018   HDL 35.20 (L) 06/24/2018   LDLDIRECT 125.0 06/24/2018     Assessment:   Dyslipidemia. Target levels for LDL are: < 130 mg/dl (2 or more risk factors are present).  Explained to the patient the respective contributions of genetics, diet, and exercise to lipid levels and the use of medication in severe cases which do not respond to lifestyle alteration. The patient's interest and motivation in making lifestyle changes seems good.   Plan:   The following changes are planned for the next 3 months, at which time the patient will return for repeat fasting lipids:  1. Dietary changes: Increase soluble  fiber. 2. Exercise changes: regular aerobic exercise - 3-5 days per week.  3. Other treatment: Weight reduction (will start with Metformin) 4. Lipid-lowering medications: none yet. (Recommended by NCEP after 3-6 mos of dietary therapy & lifestyle modification, except if CHD is present or LDL well above 190.) 5. Screening for secondary causes of dyslipidemias: SEE ORDERS.   Insulin resistance Lab Results  Component Value Date   HGBA1C 6.1 06/24/2018   Plan: Anita Webb will continue to work on weight loss, exercise, and decreasing simple carbohydrates in her diet to help decrease the risk of diabetes. We dicussed metformin including benefits and risks. She was informed that eating too many simple carbohydrates or too many calories at one sitting increases the likelihood of GI side effects.   Essential hypertension Review: no TIAs, no chest pain on exertion, no dyspnea on exertion, no swelling of ankles. Smoker: No.  Wt Readings from Last 3 Encounters:  06/24/18 297 lb (134.7 kg)  01/13/17 288 lb 12.8 oz (131 kg)  08/07/16 265 lb (120.2 kg)   BP Readings from Last 3 Encounters:  06/24/18 (!) 158/88  01/13/17 110/60  08/07/16 (!) 146/92   Lab Results  Component Value Date   CREATININE 0.75 06/24/2018   Plan: Screening labs for initial evaluation: SEE ORDER. Dietary sodium restriction. Regular aerobic exercise. Check blood pressures daily and record.  Amenorrhea Currently periods are occurring every 10-12 months. Bleeding is heavy. Periods were not regular in the past, requiring Provera. Is there a chance of pregnancy? no. HCG lab test done? yes, negative. Factors that may be contributory to menstrual abnormalities include: hormonal factors possible PCOS. Previous treatments for menstrual abnormalities include: progesterone, effective.  Assessment:   The patient has amenorrhea.   Plan:   Discussed the evaluation and treatment of amenorrhea with the patient. Discussed the complex  hormonal issues involved with polycystic ovarian syndrome including glucose intolerance. Agricultural engineer distributed. Started Provera  Started metformin    Orders Placed This Encounter  Procedures  . Flu Vaccine QUAD 6+ mos PF IM (Fluarix Quad PF)  . CBC with Differential/Platelet  . Comprehensive metabolic panel  .  Hemoglobin A1c  . Lipid panel  . HIV Antibody (routine testing w rflx)  . TSH  . VITAMIN D 25 Hydroxy (Vit-D Deficiency, Fractures)  . Testos,Total,Free and SHBG (Female)  . B-HCG Quant  . LDL cholesterol, direct   Meds ordered this encounter  Medications  . DISCONTD: medroxyPROGESTERone (PROVERA) 10 MG tablet    Sig: Take on by mouth daily x 10 days every 3-6 months to induce menses.    Dispense:  10 tablet    Refill:  3  . traZODone (DESYREL) 50 MG tablet    Sig: Take 0.5-1 tablets (25-50 mg total) by mouth at bedtime as needed for sleep.    Dispense:  30 tablet    Refill:  3  . Cholecalciferol 50000 units TABS    Sig: 50,000 units PO qwk for 12 weeks.    Dispense:  12 tablet    Refill:  0  . metFORMIN (GLUCOPHAGE XR) 750 MG 24 hr tablet    Sig: Take 2 tablets (1,500 mg total) by mouth at bedtime.    Dispense:  60 tablet    Refill:  3   . Reviewed expectations re: course of current medical issues. . Discussed self-management of symptoms. . Outlined signs and symptoms indicating need for more acute intervention. . Patient verbalized understanding and all questions were answered. Marland Kitchen Health Maintenance issues including appropriate healthy diet, exercise, and smoking avoidance were discussed with patient. . See orders for this visit as documented in the electronic medical record. . Patient received an After Visit Summary.  Helane Rima, DO [place], [facility] 06/25/2018

## 2018-06-24 ENCOUNTER — Other Ambulatory Visit: Payer: Self-pay

## 2018-06-24 ENCOUNTER — Encounter: Payer: Self-pay | Admitting: Family Medicine

## 2018-06-24 ENCOUNTER — Ambulatory Visit (INDEPENDENT_AMBULATORY_CARE_PROVIDER_SITE_OTHER): Payer: 59 | Admitting: Family Medicine

## 2018-06-24 VITALS — BP 158/88 | HR 89 | Temp 98.0°F | Resp 16 | Ht 64.0 in | Wt 297.0 lb

## 2018-06-24 DIAGNOSIS — Z13220 Encounter for screening for lipoid disorders: Secondary | ICD-10-CM | POA: Diagnosis not present

## 2018-06-24 DIAGNOSIS — Z114 Encounter for screening for human immunodeficiency virus [HIV]: Secondary | ICD-10-CM

## 2018-06-24 DIAGNOSIS — E8881 Metabolic syndrome: Secondary | ICD-10-CM

## 2018-06-24 DIAGNOSIS — N912 Amenorrhea, unspecified: Secondary | ICD-10-CM

## 2018-06-24 DIAGNOSIS — R739 Hyperglycemia, unspecified: Secondary | ICD-10-CM

## 2018-06-24 DIAGNOSIS — E782 Mixed hyperlipidemia: Secondary | ICD-10-CM | POA: Diagnosis not present

## 2018-06-24 DIAGNOSIS — E88819 Insulin resistance, unspecified: Secondary | ICD-10-CM

## 2018-06-24 DIAGNOSIS — L680 Hirsutism: Secondary | ICD-10-CM

## 2018-06-24 DIAGNOSIS — E559 Vitamin D deficiency, unspecified: Secondary | ICD-10-CM

## 2018-06-24 DIAGNOSIS — Z23 Encounter for immunization: Secondary | ICD-10-CM

## 2018-06-24 DIAGNOSIS — F5101 Primary insomnia: Secondary | ICD-10-CM

## 2018-06-24 DIAGNOSIS — I10 Essential (primary) hypertension: Secondary | ICD-10-CM

## 2018-06-24 LAB — COMPREHENSIVE METABOLIC PANEL
ALT: 31 U/L (ref 0–35)
AST: 34 U/L (ref 0–37)
Albumin: 4.6 g/dL (ref 3.5–5.2)
Alkaline Phosphatase: 59 U/L (ref 39–117)
BUN: 12 mg/dL (ref 6–23)
CO2: 24 mEq/L (ref 19–32)
Calcium: 9.3 mg/dL (ref 8.4–10.5)
Chloride: 101 mEq/L (ref 96–112)
Creatinine, Ser: 0.75 mg/dL (ref 0.40–1.20)
GFR: 91.42 mL/min (ref >60.00)
Glucose, Bld: 96 mg/dL (ref 70–99)
Potassium: 3.5 mEq/L (ref 3.5–5.1)
Sodium: 138 mEq/L (ref 135–145)
Total Bilirubin: 0.5 mg/dL (ref 0.2–1.2)
Total Protein: 7.5 g/dL (ref 6.0–8.3)

## 2018-06-24 LAB — LIPID PANEL
Cholesterol: 185 mg/dL (ref 0–200)
HDL: 35.2 mg/dL — ABNORMAL LOW (ref >39.00)
NonHDL: 149.9
Total CHOL/HDL Ratio: 5
Triglycerides: 304 mg/dL — ABNORMAL HIGH (ref 0.0–149.0)
VLDL: 60.8 mg/dL — ABNORMAL HIGH (ref 0.0–40.0)

## 2018-06-24 LAB — VITAMIN D 25 HYDROXY (VIT D DEFICIENCY, FRACTURES): VITD: 16.89 ng/mL — ABNORMAL LOW (ref 30.00–100.00)

## 2018-06-24 LAB — CBC WITH DIFFERENTIAL/PLATELET
Basophils Absolute: 0 10*3/uL (ref 0.0–0.1)
Basophils Relative: 0.8 % (ref 0.0–3.0)
Eosinophils Absolute: 0.1 10*3/uL (ref 0.0–0.7)
Eosinophils Relative: 2 % (ref 0.0–5.0)
HCT: 43.4 % (ref 36.0–46.0)
Hemoglobin: 14.7 g/dL (ref 12.0–15.0)
Lymphocytes Relative: 32.9 % (ref 12.0–46.0)
Lymphs Abs: 1.9 10*3/uL (ref 0.7–4.0)
MCHC: 33.9 g/dL (ref 30.0–36.0)
MCV: 85.7 fl (ref 78.0–100.0)
Monocytes Absolute: 0.5 10*3/uL (ref 0.1–1.0)
Monocytes Relative: 8.2 % (ref 3.0–12.0)
Neutro Abs: 3.3 10*3/uL (ref 1.4–7.7)
Neutrophils Relative %: 56.1 % (ref 43.0–77.0)
Platelets: 230 10*3/uL (ref 150.0–400.0)
RBC: 5.07 Mil/uL (ref 3.87–5.11)
RDW: 13.7 % (ref 11.5–15.5)
WBC: 5.9 10*3/uL (ref 4.0–10.5)

## 2018-06-24 LAB — TSH: TSH: 3.45 u[IU]/mL (ref 0.35–4.50)

## 2018-06-24 LAB — HCG, QUANTITATIVE, PREGNANCY: Quantitative HCG: 0.47 m[IU]/mL

## 2018-06-24 LAB — LDL CHOLESTEROL, DIRECT: Direct LDL: 125 mg/dL

## 2018-06-24 LAB — HEMOGLOBIN A1C: Hgb A1c MFr Bld: 6.1 % (ref 4.6–6.5)

## 2018-06-24 MED ORDER — TRAZODONE HCL 50 MG PO TABS: 25.0000 mg | ORAL_TABLET | Freq: Every evening | ORAL | 3 refills | Status: DC | PRN

## 2018-06-24 MED ORDER — MEDROXYPROGESTERONE ACETATE 10 MG PO TABS: ORAL_TABLET | ORAL | 3 refills | Status: DC

## 2018-06-25 DIAGNOSIS — I10 Essential (primary) hypertension: Secondary | ICD-10-CM | POA: Insufficient documentation

## 2018-06-25 DIAGNOSIS — E8881 Metabolic syndrome: Secondary | ICD-10-CM | POA: Insufficient documentation

## 2018-06-25 DIAGNOSIS — E88819 Insulin resistance, unspecified: Secondary | ICD-10-CM | POA: Insufficient documentation

## 2018-06-25 DIAGNOSIS — N912 Amenorrhea, unspecified: Secondary | ICD-10-CM | POA: Insufficient documentation

## 2018-06-25 DIAGNOSIS — E7800 Pure hypercholesterolemia, unspecified: Secondary | ICD-10-CM | POA: Insufficient documentation

## 2018-06-25 DIAGNOSIS — E559 Vitamin D deficiency, unspecified: Secondary | ICD-10-CM | POA: Insufficient documentation

## 2018-06-25 DIAGNOSIS — L680 Hirsutism: Secondary | ICD-10-CM | POA: Insufficient documentation

## 2018-06-25 DIAGNOSIS — F5101 Primary insomnia: Secondary | ICD-10-CM | POA: Insufficient documentation

## 2018-06-25 MED ORDER — METFORMIN HCL ER 750 MG PO TB24: 1500.0000 mg | ORAL_TABLET | Freq: Every day | ORAL | 3 refills | Status: DC

## 2018-06-25 MED ORDER — CHOLECALCIFEROL 1.25 MG (50000 UT) PO TABS: ORAL_TABLET | ORAL | 0 refills | Status: DC

## 2018-06-25 NOTE — Assessment & Plan Note (Signed)
Review: no TIAs, no chest pain on exertion, no dyspnea on exertion, no swelling of ankles. Smoker: No.  Wt Readings from Last 3 Encounters:  06/24/18 297 lb (134.7 kg)  01/13/17 288 lb 12.8 oz (131 kg)  08/07/16 265 lb (120.2 kg)   BP Readings from Last 3 Encounters:  06/24/18 (!) 158/88  01/13/17 110/60  08/07/16 (!) 146/92   Lab Results  Component Value Date   CREATININE 0.75 06/24/2018   Plan: Screening labs for initial evaluation: SEE ORDER. Dietary sodium restriction. Regular aerobic exercise. Check blood pressures daily and record.

## 2018-06-25 NOTE — Assessment & Plan Note (Addendum)
Lab Results  Component Value Date   HGBA1C 6.1 06/24/2018   Plan: Anita Webb will continue to work on weight loss, exercise, and decreasing simple carbohydrates in her diet to help decrease the risk of diabetes. We dicussed metformin including benefits and risks. She was informed that eating too many simple carbohydrates or too many calories at one sitting increases the likelihood of GI side effects.

## 2018-06-25 NOTE — Assessment & Plan Note (Addendum)
Currently periods are occurring every 10-12 months. Bleeding is heavy. Periods were not regular in the past, requiring Provera. Is there a chance of pregnancy? no. HCG lab test done? yes, negative. Factors that may be contributory to menstrual abnormalities include: hormonal factors possible PCOS. Previous treatments for menstrual abnormalities include: progesterone, effective.  Assessment:   The patient has amenorrhea.   Plan:   Discussed the evaluation and treatment of amenorrhea with the patient. Discussed the complex hormonal issues involved with polycystic ovarian syndrome including glucose intolerance. Agricultural engineer distributed. Started Provera  Started metformin

## 2018-06-25 NOTE — Assessment & Plan Note (Signed)
Patient complains of early morning awakening, frequent night time awakening and difficulty falling asleep for the past 1 year.  Associated symptoms include daytime somnolence and fatigue. Average duration of sleep 6 hours/night with a few awakenings/night. The patient has been taking: Benadryl. Side effects from the medication: sedation.   Plan: Discussed sleep hygiene measures including regular sleep schedule, optimal sleep environment, and relaxing presleep rituals. Avoid daytime naps. Avoid caffeine after noon. Avoid excess alcohol. Avoid tobacco. Recommended daily exercise.  Prescription provided today: Trazdone.

## 2018-06-25 NOTE — Assessment & Plan Note (Signed)
Anita Webb was informed that low vitamin D levels contributes to fatigue and are associated with obesity, breast, and colon cancer. Current vitamin D level is 16.89. Goal vitamin D level > 40. She will supplement as below and recheck in 3-6 months.  [x]   Vitamin D 50,000 IU x 12 weeks, then 2000 IU daily. []   2000 IU daily.

## 2018-06-25 NOTE — Assessment & Plan Note (Addendum)
Anita Webb is a 37 y.o. female here for discussion regarding weight loss. History of eating disorders: none. There is a family history positive for obesity in the patient. Previous treatments for obesity include self-directed dieting. Obesity associated medical conditions: amenorrhea, hirsutism, hyperlipidemia and hypertension. Obesity associated medications: none.   Contraindications to weight loss: none. Patient readiness to commit to diet and activity changes: excellent. Barriers to weight loss: none.  Obesity. I assessed Anita Webb to be in an action stage with respect to weight loss.   Plan: 1. Diagnostic studies to rule out secondary causes of obesity: see below. 2. General patient education:   Importance of long-term maintenance treatment in weight loss.  Use non-food self-rewards to reinforce behavior changes.  Elicit support from others; identify saboteurs. 3. Diet interventions:   Risks of dieting were reviewed, including fatigue, temporary hair loss, gallstone formation, gout, and with very low calorie diets, electrolyte abnormalities, nutrient inadequacies, and loss of lean body mass. 4. Exercise intervention:   Informal measures, e.g. taking stairs instead of elevator.  Formal exercise regimen options discussed as well. 5. Other behavioral treatment: stress management. 6. Other treatment: Medication: Metformin. 7. Patient to keep a weight log that we will review at follow up. 8. Follow up: 3 months and as needed.

## 2018-06-25 NOTE — Assessment & Plan Note (Signed)
Lipids:    Component Value Date/Time   CHOL 185 06/24/2018 0810   TRIG 304.0 (H) 06/24/2018 0810   HDL 35.20 (L) 06/24/2018 0810   LDLDIRECT 125.0 06/24/2018 0810   VLDL 60.8 (H) 06/24/2018 0810   CHOLHDL 5 06/24/2018 0810   The patient does not use medications that may worsen dyslipidemias (corticosteroids, progestins, anabolic steroids, diuretics, beta-blockers, amiodarone, cyclosporine, olanzapine). The patient exercises intermittently. The patient is known to have coexisting coronary artery disease.   Lab Review Lab Results  Component Value Date   CHOL 185 06/24/2018   HDL 35.20 (L) 06/24/2018   LDLDIRECT 125.0 06/24/2018     Assessment:   Dyslipidemia. Target levels for LDL are: < 130 mg/dl (2 or more risk factors are present).  Explained to the patient the respective contributions of genetics, diet, and exercise to lipid levels and the use of medication in severe cases which do not respond to lifestyle alteration. The patient's interest and motivation in making lifestyle changes seems good.   Plan:   The following changes are planned for the next 3 months, at which time the patient will return for repeat fasting lipids:  1. Dietary changes: Increase soluble fiber. 2. Exercise changes: regular aerobic exercise - 3-5 days per week.  3. Other treatment: Weight reduction (will start with Metformin) 4. Lipid-lowering medications: none yet. (Recommended by NCEP after 3-6 mos of dietary therapy & lifestyle modification, except if CHD is present or LDL well above 190.) 5. Screening for secondary causes of dyslipidemias: SEE ORDERS.

## 2018-06-28 LAB — TESTOS,TOTAL,FREE AND SHBG (FEMALE)
Free Testosterone: 9.2 pg/mL — ABNORMAL HIGH (ref 0.1–6.4)
Sex Hormone Binding: 47 nmol/L (ref 17–124)
Testosterone, Total, LC-MS-MS: 69 ng/dL — ABNORMAL HIGH (ref 2–45)

## 2018-06-28 LAB — HIV ANTIBODY (ROUTINE TESTING W REFLEX): HIV 1&2 Ab, 4th Generation: NONREACTIVE

## 2018-07-01 ENCOUNTER — Telehealth: Payer: Self-pay | Admitting: Family Medicine

## 2018-07-01 NOTE — Telephone Encounter (Signed)
Charted in result notes. 

## 2018-07-01 NOTE — Telephone Encounter (Signed)
Patient returning phone call for lab results. NT currently unavailable.   Copied from CRM [id number]. Topic: Quick Communication - Lab Results (Clinic Use ONLY) >> Jul 01, 2018 10:08 AM Donnamarie Poag, CMA wrote: Called patient to inform them of 07/01/18 lab results. When patient returns call, triage nurse may disclose results.

## 2018-08-01 ENCOUNTER — Encounter: Payer: Self-pay | Admitting: Family Medicine

## 2018-08-01 ENCOUNTER — Ambulatory Visit: Payer: 59 | Admitting: Family Medicine

## 2018-08-01 VITALS — BP 156/88 | HR 100 | Temp 98.2°F | Ht 64.0 in | Wt 296.8 lb

## 2018-08-01 DIAGNOSIS — F5101 Primary insomnia: Secondary | ICD-10-CM | POA: Diagnosis not present

## 2018-08-01 DIAGNOSIS — E7800 Pure hypercholesterolemia, unspecified: Secondary | ICD-10-CM

## 2018-08-01 DIAGNOSIS — E559 Vitamin D deficiency, unspecified: Secondary | ICD-10-CM

## 2018-08-01 DIAGNOSIS — E8881 Metabolic syndrome: Secondary | ICD-10-CM | POA: Diagnosis not present

## 2018-08-01 DIAGNOSIS — Z1331 Encounter for screening for depression: Secondary | ICD-10-CM

## 2018-08-01 DIAGNOSIS — E88819 Insulin resistance, unspecified: Secondary | ICD-10-CM

## 2018-08-01 DIAGNOSIS — L680 Hirsutism: Secondary | ICD-10-CM

## 2018-08-01 DIAGNOSIS — N912 Amenorrhea, unspecified: Secondary | ICD-10-CM

## 2018-08-01 DIAGNOSIS — E282 Polycystic ovarian syndrome: Secondary | ICD-10-CM

## 2018-08-01 DIAGNOSIS — I10 Essential (primary) hypertension: Secondary | ICD-10-CM | POA: Diagnosis not present

## 2018-08-01 MED ORDER — DULAGLUTIDE 0.75 MG/0.5ML ~~LOC~~ SOAJ: SUBCUTANEOUS | 1 refills | Status: DC

## 2018-08-01 MED ORDER — SPIRONOLACTONE 50 MG PO TABS: 50.0000 mg | ORAL_TABLET | Freq: Every day | ORAL | 2 refills | Status: DC

## 2018-08-01 NOTE — Progress Notes (Signed)
Anita Webb is a 37 y.o. female is here for follow up.  History of Present Illness:   Anita Webb, CMA acting as scribe for Dr. Helane RimaErica Jordanna Hendrie.   HPI: Patient in office for follow up on amenorrhea. She did have a normal cycle on November 17. It lasted 6 days and was normal.   PCOS. Medication compliance: compliant all of the time, diabetic diet compliance: compliant most of the time, home glucose monitoring: is not performed, further diabetic ROS: no polyuria or polydipsia, no chest pain, dyspnea or TIA's, no numbness, tingling or pain in extremities.  Health Maintenance Due  Topic Date Due  . PAP SMEAR  06/15/2000   Depression screen PHQ 2/9 08/01/2018  Decreased Interest 1  Down, Depressed, Hopeless 1  PHQ - 2 Score 2  Altered sleeping 0  Tired, decreased energy 2  Change in appetite 3  Feeling bad or failure about yourself  3  Trouble concentrating 2  Moving slowly or fidgety/restless 0  Suicidal thoughts 0  PHQ-9 Score 12  Difficult doing work/chores Somewhat difficult     PMHx, SurgHx, SocialHx, FamHx, Medications, and Allergies were reviewed in the Visit Navigator and updated as appropriate.   Patient Active Problem List   Diagnosis Date Noted  . Primary insomnia 06/25/2018  . Hirsutism 06/25/2018  . Amenorrhea 06/25/2018  . Essential hypertension 06/25/2018  . Insulin resistance 06/25/2018  . Pure hypercholesterolemia 06/25/2018  . Vitamin D deficiency 06/25/2018  . Allergic rhinitis 02/22/2015  . Morbid obesity (HCC) 02/22/2015   Social History   Tobacco Use  . Smoking status: Never Smoker  . Smokeless tobacco: Never Used  Substance Use Topics  . Alcohol use: Yes    Comment: 1-2 drink per week  . Drug use: No   Current Medications and Allergies:   .  Cholecalciferol 50000 units TABS, 50,000 units PO qwk for 12 weeks., Disp: 12 tablet, Rfl: 0 .  magnesium 30 MG tablet, Take 30 mg by mouth 2 (two) times daily., Disp: , Rfl:  .   medroxyPROGESTERone (PROVERA) 10 MG tablet, Take one by mouth daily x 10 days every 3-6 months to induce menses., Disp: 10 tablet, Rfl: 3 .  metFORMIN (GLUCOPHAGE XR) 750 MG 24 hr tablet, Take 2 tablets (1,500 mg total) by mouth at bedtime., Disp: 60 tablet, Rfl: 3 .  traZODone (DESYREL) 50 MG tablet, Take 0.5-1 tablets (25-50 mg total) by mouth at bedtime as needed for sleep., Disp: 30 tablet, Rfl: 3   Allergies  Allergen Reactions  . Erythromycin     GI upset   Review of Systems   Pertinent items are noted in the HPI. Otherwise, a complete ROS is negative.  Vitals:   Vitals:   08/01/18 0806  BP: (!) 156/88  Pulse: 100  Temp: 98.2 F (36.8 C)  TempSrc: Oral  SpO2: 99%  Weight: 296 lb 12.8 oz (134.6 kg)  Height: 5\' 4"  (1.626 m)     Body mass index is 50.95 kg/m.  Physical Exam:   Physical Exam Vitals signs and nursing note reviewed.  HENT:     Head: Normocephalic and atraumatic.  Eyes:     Pupils: Pupils are equal, round, and reactive to light.  Neck:     Musculoskeletal: Normal range of motion and neck supple.  Cardiovascular:     Rate and Rhythm: Normal rate and regular rhythm.     Heart sounds: Normal heart sounds.  Pulmonary:     Effort: Pulmonary effort is normal.  Abdominal:     Palpations: Abdomen is soft.  Skin:    General: Skin is warm.  Psychiatric:        Behavior: Behavior normal.    Results for orders placed or performed in visit on 06/24/18  CBC with Differential/Platelet  Result Value Ref Range   WBC 5.9 4.0 - 10.5 K/uL   RBC 5.07 3.87 - 5.11 Mil/uL   Hemoglobin 14.7 12.0 - 15.0 g/dL   HCT 16.1 09.6 - 04.5 %   MCV 85.7 78.0 - 100.0 fl   MCHC 33.9 30.0 - 36.0 g/dL   RDW 40.9 81.1 - 91.4 %   Platelets 230.0 150.0 - 400.0 K/uL   Neutrophils Relative % 56.1 43.0 - 77.0 %   Lymphocytes Relative 32.9 12.0 - 46.0 %   Monocytes Relative 8.2 3.0 - 12.0 %   Eosinophils Relative 2.0 0.0 - 5.0 %   Basophils Relative 0.8 0.0 - 3.0 %   Neutro Abs  3.3 1.4 - 7.7 K/uL   Lymphs Abs 1.9 0.7 - 4.0 K/uL   Monocytes Absolute 0.5 0.1 - 1.0 K/uL   Eosinophils Absolute 0.1 0.0 - 0.7 K/uL   Basophils Absolute 0.0 0.0 - 0.1 K/uL  Comprehensive metabolic panel  Result Value Ref Range   Sodium 138 135 - 145 mEq/L   Potassium 3.5 3.5 - 5.1 mEq/L   Chloride 101 96 - 112 mEq/L   CO2 24 19 - 32 mEq/L   Glucose, Bld 96 70 - 99 mg/dL   BUN 12 6 - 23 mg/dL   Creatinine, Ser 7.82 0.40 - 1.20 mg/dL   Total Bilirubin 0.5 0.2 - 1.2 mg/dL   Alkaline Phosphatase 59 39 - 117 U/L   AST 34 0 - 37 U/L   ALT 31 0 - 35 U/L   Total Protein 7.5 6.0 - 8.3 g/dL   Albumin 4.6 3.5 - 5.2 g/dL   Calcium 9.3 8.4 - 95.6 mg/dL   GFR 21.30 >86.57 mL/min  Hemoglobin A1c  Result Value Ref Range   Hgb A1c MFr Bld 6.1 4.6 - 6.5 %  Lipid panel  Result Value Ref Range   Cholesterol 185 0 - 200 mg/dL   Triglycerides 846.9 (H) 0.0 - 149.0 mg/dL   HDL 62.95 (L) >28.41 mg/dL   VLDL 32.4 (H) 0.0 - 40.1 mg/dL   Total CHOL/HDL Ratio 5    NonHDL 149.90   HIV Antibody (routine testing w rflx)  Result Value Ref Range   HIV 1&2 Ab, 4th Generation NON-REACTIVE NON-REACTI  TSH  Result Value Ref Range   TSH 3.45 0.35 - 4.50 uIU/mL  VITAMIN D 25 Hydroxy (Vit-D Deficiency, Fractures)  Result Value Ref Range   VITD 16.89 (L) 30.00 - 100.00 ng/mL  Testos,Total,Free and SHBG (Female)  Result Value Ref Range   Testosterone, Total, LC-MS-MS 69 (H) 2 - 45 ng/dL   Free Testosterone 9.2 (H) 0.1 - 6.4 pg/mL   Sex Hormone Binding 47 17 - 124 nmol/L  B-HCG Quant  Result Value Ref Range   Quantitative HCG 0.47 mIU/ml  LDL cholesterol, direct  Result Value Ref Range   Direct LDL 125.0 mg/dL   Assessment and Plan:   Anita Webb was seen today for follow-up.  Diagnoses and all orders for this visit:  Essential hypertension Comments: Elevated today. Patient endorses white coat syndrome. Will start Spironolactone today for hursutism and see if this helps her BP. Orders: -      spironolactone (ALDACTONE) 50 MG tablet; Take 1 tablet (  50 mg total) by mouth daily. -     Comprehensive metabolic panel; Future  Insulin resistance Comments: Tolerating Metformin. Discussed GLP1-RA for weight loss in patient with PCOS and insulin resistance. See orders.  Orders: -     Hemoglobin A1c; Future -     Dulaglutide (TRULICITY) 0.75 MG/0.5ML SOPN; 0.75 mg [place] q wk x 2 wks , then increase to 1.5 mg [place] if tolerating  Pure hypercholesterolemia -     Lipid panel; Future  Primary insomnia Comments: Improved with Trazodone. Will continue.  Morbid obesity (HCC) Comments: Medications, exercise, and diet reviewed.   Hirsutism Comments: Spironolactone discussed as an option. See orders. Reviewed potassium issues. Will recheck labs in 2-4 weeks.  Orders: -     spironolactone (ALDACTONE) 50 MG tablet; Take 1 tablet (50 mg total) by mouth daily.  Vitamin D deficiency Comments: Taking high dose vitamin D now. Will recheck in 3-6 months.  Amenorrhea Comments: Menses with Provera. Orders: -     spironolactone (ALDACTONE) 50 MG tablet; Take 1 tablet (50 mg total) by mouth daily.  PCOS (polycystic ovarian syndrome) Comments: Meets criteria. Reviewed diagnosis. See orders. Orders: -     spironolactone (ALDACTONE) 50 MG tablet; Take 1 tablet (50 mg total) by mouth daily.   . Orders and follow up as documented in EpicCare, reviewed diet, exercise and weight control, cardiovascular risk and specific lipid/LDL goals reviewed, reviewed medications and side effects in detail.  . Reviewed expectations re: course of current medical issues. . Outlined signs and symptoms indicating need for more acute intervention. . Patient verbalized understanding and all questions were answered. . Patient received an After Visit Summary.  CMA served as Neurosurgeon during this visit. History, Physical, and Plan performed by medical provider. The above documentation has been reviewed and is accurate and  complete. Helane Rima, D.O.  Helane Rima, DO [place], [facility] 08/04/2018

## 2018-08-23 ENCOUNTER — Telehealth: Payer: Self-pay | Admitting: Family Medicine

## 2018-08-23 ENCOUNTER — Other Ambulatory Visit: Payer: Self-pay

## 2018-08-23 DIAGNOSIS — E8881 Metabolic syndrome: Secondary | ICD-10-CM

## 2018-08-23 MED ORDER — DULAGLUTIDE 0.75 MG/0.5ML ~~LOC~~ SOAJ: 1.5000 mg | SUBCUTANEOUS | 1 refills | Status: DC

## 2018-08-23 NOTE — Telephone Encounter (Signed)
See note

## 2018-08-23 NOTE — Telephone Encounter (Signed)
Copied from CRM [phone]. Topic: Quick Communication - Rx Refill/Question >> Aug 23, 2018 12:15 PM Lynne LoganHudson, Caryn D wrote: Medication: Dulaglutide (TRULICITY) 0.75 MG/0.5ML SOPN / Pt stated that Dr. Earlene PlaterWallace had her increase her dosage and the pharmacy instructed that a new rx for the higher amount would need to be sent in. Please advise  Has the patient contacted their pharmacy? Yes.   (Agent: If no, request that the patient contact the pharmacy for the refill.) (Agent: If yes, when and what did the pharmacy advise?)  Preferred Pharmacy (with phone number or street name): [facility] [place] [address] [phone] (Phone) [phone] (Fax)    Agent: Please be advised that RX refills may take up to 3 business days. We ask that you follow-up with your pharmacy.

## 2018-08-23 NOTE — Telephone Encounter (Signed)
Script sent in

## 2018-08-23 NOTE — Telephone Encounter (Signed)
Dulaglutide (TRULICITY) 0.75 MG/0.5ML SOPN /  Pt stated that Dr. Earlene PlaterWallace  increased dosage. Pharmacy instructed that a new rx for the higher amount would need to be sent in. Please advise    Preferred Pharmacy (with phone number or street name): [facility] - [place] - [address]      [phone] (Phone) [phone] (Fax)

## 2018-08-26 ENCOUNTER — Other Ambulatory Visit: Payer: Self-pay

## 2018-08-26 DIAGNOSIS — E8881 Metabolic syndrome: Secondary | ICD-10-CM

## 2018-08-26 MED ORDER — DULAGLUTIDE 1.5 MG/0.5ML ~~LOC~~ SOAJ: 1.5000 mg | SUBCUTANEOUS | 3 refills | Status: DC

## 2018-08-29 ENCOUNTER — Other Ambulatory Visit (INDEPENDENT_AMBULATORY_CARE_PROVIDER_SITE_OTHER): Payer: 59

## 2018-08-29 DIAGNOSIS — I10 Essential (primary) hypertension: Secondary | ICD-10-CM

## 2018-08-29 DIAGNOSIS — E7800 Pure hypercholesterolemia, unspecified: Secondary | ICD-10-CM | POA: Diagnosis not present

## 2018-08-29 DIAGNOSIS — E8881 Metabolic syndrome: Secondary | ICD-10-CM

## 2018-08-29 LAB — COMPREHENSIVE METABOLIC PANEL
ALT: 39 U/L — ABNORMAL HIGH (ref 0–35)
AST: 34 U/L (ref 0–37)
Albumin: 4.5 g/dL (ref 3.5–5.2)
Alkaline Phosphatase: 48 U/L (ref 39–117)
BUN: 19 mg/dL (ref 6–23)
CO2: 27 mEq/L (ref 19–32)
Calcium: 9.8 mg/dL (ref 8.4–10.5)
Chloride: 101 mEq/L (ref 96–112)
Creatinine, Ser: 1.03 mg/dL (ref 0.40–1.20)
GFR: 63.34 mL/min (ref >60.00)
Glucose, Bld: 98 mg/dL (ref 70–99)
Potassium: 4.3 mEq/L (ref 3.5–5.1)
Sodium: 137 mEq/L (ref 135–145)
Total Bilirubin: 0.4 mg/dL (ref 0.2–1.2)
Total Protein: 7.2 g/dL (ref 6.0–8.3)

## 2018-08-29 LAB — LIPID PANEL
Cholesterol: 147 mg/dL (ref 0–200)
HDL: 34.4 mg/dL — ABNORMAL LOW (ref >39.00)
NonHDL: 113.04
Total CHOL/HDL Ratio: 4
Triglycerides: 388 mg/dL — ABNORMAL HIGH (ref 0.0–149.0)
VLDL: 77.6 mg/dL — ABNORMAL HIGH (ref 0.0–40.0)

## 2018-08-29 LAB — LDL CHOLESTEROL, DIRECT: Direct LDL: 90 mg/dL

## 2018-08-29 LAB — HEMOGLOBIN A1C: Hgb A1c MFr Bld: 5.7 % (ref 4.6–6.5)

## 2018-09-26 ENCOUNTER — Encounter: Payer: Self-pay | Admitting: Family Medicine

## 2018-09-26 ENCOUNTER — Ambulatory Visit: Payer: 59 | Admitting: Family Medicine

## 2018-10-30 NOTE — Progress Notes (Signed)
Anita Webb is a 38 y.o. female is here for follow up.  Assessment and Plan:   Vitamin D deficiency Well controlled.  No signs of complications, medication side effects, or red flags.  Continue current regimen.    Pure hypercholesterolemia Will recheck at next visit.  This is likely going to improve significantly with treatment.  Primary insomnia Controlled.  Continue current treatment.  Morbid obesity (HCC) Patient is down 10 pounds since her last visit in December.  She is very happy with a GLP-1 receptor agonist.  Because of this site reaction, need to stop the injection.  We will try oral GLP-1 receptor agonist.  Insulin resistance Improved.  Trial oral GL P1 receptor agonist.  Hirsutism Improved.  Continue current treatment.  Hepatic steatosis With elevated liver function test at last visit.  Will monitor.  Essential hypertension Now controlled.  Continue current treatment.  Recheck labs at next visit.  Orders Placed This Encounter  Procedures  . CBC with Differential/Platelet  . Comprehensive metabolic panel  . Hemoglobin A1c  . Lipid panel  . Vitamin B12  . VITAMIN D 25 Hydroxy (Vit-D Deficiency, Fractures)   Meds ordered this encounter  Medications  . Semaglutide (RYBELSUS) 7 MG TABS    Sig: Take 1 tablet by mouth daily.    Dispense:  30 tablet    Refill:  3   Subjective:   HPI: Site reaction to Trulicity.  Prior to this dye reaction, the patient did very well with this medication.  She denied any side effects of nausea, vomiting, or diarrhea.  This helped with controlling blood sugars and controlling appetite.  She actually lost 15 pounds prior to having to stop medication.  Unfortunately, she did have red site reaction x2 after injections.  Tolerating metformin without any issues.  Taking B12.  Finished vitamin D supplements and is now taking 2000 international units daily.  Tolerating spironolactone without any side effects.  Feels that it is helping  with facial hair growth.  Blood pressures also improved.  She did have a.  In January without any medication and is excited about that as well.  Health Maintenance:   Health Maintenance Due  Topic Date Due  . PAP SMEAR-Modifier  06/15/2000   Depression screen PHQ 2/9 08/01/2018  Decreased Interest 1  Down, Depressed, Hopeless 1  PHQ - 2 Score 2  Altered sleeping 0  Tired, decreased energy 2  Change in appetite 3  Feeling bad or failure about yourself  3  Trouble concentrating 2  Moving slowly or fidgety/restless 0  Suicidal thoughts 0  PHQ-9 Score 12  Difficult doing work/chores Somewhat difficult   PMHx, SurgHx, SocialHx, FamHx, Medications, and Allergies were reviewed in the Visit Navigator and updated as appropriate.   Patient Active Problem List   Diagnosis Date Noted  . Elevated LFTs 10/31/2018  . Hepatic steatosis 10/31/2018  . Primary insomnia 06/25/2018  . Hirsutism 06/25/2018  . Amenorrhea 06/25/2018  . Essential hypertension 06/25/2018  . Insulin resistance 06/25/2018  . Pure hypercholesterolemia 06/25/2018  . Vitamin D deficiency 06/25/2018  . Allergic rhinitis 02/22/2015  . Morbid obesity (HCC) 02/22/2015   Social History   Tobacco Use  . Smoking status: Never Smoker  . Smokeless tobacco: Never Used  Substance Use Topics  . Alcohol use: Yes    Comment: 1-2 drink per week  . Drug use: No   Current Medications and Allergies:   .  Cholecalciferol (VITAMIN D3) 1.25 MG (50000 UT) CAPS, Take 1  capsule by mouth once a week., Disp: , Rfl:  .  magnesium 30 MG tablet, Take 30 mg by mouth 2 (two) times daily., Disp: , Rfl:  .  medroxyPROGESTERone (PROVERA) 10 MG tablet, Take one by mouth daily x 10 days every 3-6 months to induce menses., Disp: 10 tablet, Rfl: 3 .  metFORMIN (GLUCOPHAGE XR) 750 MG 24 hr tablet, Take 2 tablets (1,500 mg total) by mouth at bedtime., Disp: 60 tablet, Rfl: 3 .  spironolactone (ALDACTONE) 50 MG tablet, Take 1 tablet (50 mg total) by  mouth daily., Disp: 30 tablet, Rfl: 2 .  traZODone (DESYREL) 50 MG tablet, Take 0.5-1 tablets (25-50 mg total) by mouth at bedtime as needed for sleep., Disp: 30 tablet, Rfl: 3   Allergies  Allergen Reactions  . Erythromycin     GI upset   Review of Systems   Pertinent items are noted in the HPI. Otherwise, ROS is negative.  Vitals:   Vitals:   10/31/18 0742  BP: 130/86  Pulse: 99  Temp: 98 F (36.7 C)  TempSrc: Oral  SpO2: 99%  Weight: 287 lb (130.2 kg)  Height: 5\' 4"  (1.626 m)     Body mass index is 49.26 kg/m.   Physical Exam:   General: Cooperative, alert and oriented, well developed, well nourished, in no acute distress. HEENT: Pupils equal round reactive light and extraocular movements intact. Conjunctivae and lids unremarkable. No pallor or cyanosis, dentition good. Neck: No thyromegaly.  Cardiovascular: Regular rhythm. No murmurs appreciated.  Lungs: Normal work of breathing. Clear bilaterally without rales, rhonchi, or wheezing.  Abdomen: Soft, nontender, no masses. Normal bowel sounds. Extremities: No clubbing, cyanosis, erythema. No edema.  Skin: Warm and dry. Neurologic: No focal deficits.  Psychiatric: Normal affect and thought content.   . Reviewed expectations re: course of current medical issues. . Discussed self-management of symptoms. . Outlined signs and symptoms indicating need for more acute intervention. . Patient verbalized understanding and all questions were answered. Marland Kitchen Health Maintenance issues including appropriate healthy diet, exercise, and smoking avoidance were discussed with patient. . See orders for this visit as documented in the electronic medical record. . Patient received an After Visit Summary.  Helane Rima, DO [place], [facility] 10/31/2018

## 2018-10-31 ENCOUNTER — Encounter: Payer: Self-pay | Admitting: Family Medicine

## 2018-10-31 ENCOUNTER — Telehealth: Payer: Self-pay

## 2018-10-31 ENCOUNTER — Other Ambulatory Visit: Payer: Self-pay | Admitting: Family Medicine

## 2018-10-31 ENCOUNTER — Ambulatory Visit (INDEPENDENT_AMBULATORY_CARE_PROVIDER_SITE_OTHER): Payer: 59 | Admitting: Family Medicine

## 2018-10-31 VITALS — BP 130/86 | HR 99 | Temp 98.0°F | Ht 64.0 in | Wt 287.0 lb

## 2018-10-31 DIAGNOSIS — E8881 Metabolic syndrome: Secondary | ICD-10-CM

## 2018-10-31 DIAGNOSIS — L680 Hirsutism: Secondary | ICD-10-CM

## 2018-10-31 DIAGNOSIS — I10 Essential (primary) hypertension: Secondary | ICD-10-CM

## 2018-10-31 DIAGNOSIS — F5101 Primary insomnia: Secondary | ICD-10-CM

## 2018-10-31 DIAGNOSIS — N912 Amenorrhea, unspecified: Secondary | ICD-10-CM

## 2018-10-31 DIAGNOSIS — R7989 Other specified abnormal findings of blood chemistry: Secondary | ICD-10-CM

## 2018-10-31 DIAGNOSIS — E559 Vitamin D deficiency, unspecified: Secondary | ICD-10-CM

## 2018-10-31 DIAGNOSIS — E282 Polycystic ovarian syndrome: Secondary | ICD-10-CM

## 2018-10-31 DIAGNOSIS — R945 Abnormal results of liver function studies: Secondary | ICD-10-CM

## 2018-10-31 DIAGNOSIS — K760 Fatty (change of) liver, not elsewhere classified: Secondary | ICD-10-CM | POA: Insufficient documentation

## 2018-10-31 DIAGNOSIS — E88819 Insulin resistance, unspecified: Secondary | ICD-10-CM

## 2018-10-31 DIAGNOSIS — E782 Mixed hyperlipidemia: Secondary | ICD-10-CM

## 2018-10-31 DIAGNOSIS — Z79899 Other long term (current) drug therapy: Secondary | ICD-10-CM

## 2018-10-31 DIAGNOSIS — E7800 Pure hypercholesterolemia, unspecified: Secondary | ICD-10-CM

## 2018-10-31 DIAGNOSIS — Z6841 Body Mass Index (BMI) 40.0 and over, adult: Secondary | ICD-10-CM

## 2018-10-31 MED ORDER — SEMAGLUTIDE 7 MG PO TABS: 1.0000 | ORAL_TABLET | Freq: Every day | ORAL | 3 refills | Status: DC

## 2018-10-31 NOTE — Assessment & Plan Note (Signed)
Improved.  Trial oral GL P1 receptor agonist.

## 2018-10-31 NOTE — Assessment & Plan Note (Signed)
Patient is down 10 pounds since her last visit in December.  She is very happy with a GLP-1 receptor agonist.  Because of this site reaction, need to stop the injection.  We will try oral GLP-1 receptor agonist.

## 2018-10-31 NOTE — Assessment & Plan Note (Signed)
Improved.  Continue current treatment. 

## 2018-10-31 NOTE — Assessment & Plan Note (Signed)
With elevated liver function test at last visit.  Will monitor.

## 2018-10-31 NOTE — Assessment & Plan Note (Signed)
Controlled. Continue current treatment. 

## 2018-10-31 NOTE — Assessment & Plan Note (Signed)
Will recheck at next visit.  This is likely going to improve significantly with treatment.

## 2018-10-31 NOTE — Assessment & Plan Note (Signed)
Well controlled.  No signs of complications, medication side effects, or red flags.  Continue current regimen.   

## 2018-10-31 NOTE — Telephone Encounter (Signed)
Anita Webb (Key: [id number])  Rx #: [id number]  Rybelsus 7MG  tablets  Form Marketing executive PA Form  Created  44 minutes ago  Sent to Plan  less than a minute ago  Determination    Wait for Questions [employer] typically responds with questions in less than 15 minutes, but may take up to 24 hours.

## 2018-10-31 NOTE — Assessment & Plan Note (Signed)
Now controlled.  Continue current treatment.  Recheck labs at next visit.

## 2018-11-02 NOTE — Telephone Encounter (Signed)
Alex Rollyson (Key: [id number])  Rx #: [id number]  Rybelsus 7MG  tablets  Form Marketing executive PA Form  Created  2 days ago  Sent to Plan  2 days ago  Plan Response  2 days ago  Submit Clinical Questions  2 days ago  Determination  Favorable  2 days ago  Message from [employer] PA request has been approved. Additional information will be provided in the approval communication. (Message 1145)

## 2018-11-27 ENCOUNTER — Other Ambulatory Visit: Payer: Self-pay | Admitting: Family Medicine

## 2018-11-27 DIAGNOSIS — E8881 Metabolic syndrome: Secondary | ICD-10-CM

## 2018-11-27 DIAGNOSIS — L680 Hirsutism: Secondary | ICD-10-CM

## 2018-11-27 DIAGNOSIS — E282 Polycystic ovarian syndrome: Secondary | ICD-10-CM

## 2018-11-27 DIAGNOSIS — E782 Mixed hyperlipidemia: Secondary | ICD-10-CM

## 2018-11-27 DIAGNOSIS — N912 Amenorrhea, unspecified: Secondary | ICD-10-CM

## 2018-11-27 DIAGNOSIS — I10 Essential (primary) hypertension: Secondary | ICD-10-CM

## 2018-11-27 DIAGNOSIS — F5101 Primary insomnia: Secondary | ICD-10-CM

## 2018-12-07 ENCOUNTER — Encounter: Payer: 59 | Admitting: Family Medicine

## 2018-12-07 ENCOUNTER — Other Ambulatory Visit: Payer: 59

## 2018-12-14 ENCOUNTER — Encounter: Payer: 59 | Admitting: Family Medicine

## 2018-12-26 ENCOUNTER — Other Ambulatory Visit: Payer: Self-pay | Admitting: Family Medicine

## 2018-12-26 DIAGNOSIS — N912 Amenorrhea, unspecified: Secondary | ICD-10-CM

## 2018-12-26 DIAGNOSIS — E782 Mixed hyperlipidemia: Secondary | ICD-10-CM

## 2018-12-26 DIAGNOSIS — E88819 Insulin resistance, unspecified: Secondary | ICD-10-CM

## 2018-12-26 DIAGNOSIS — E282 Polycystic ovarian syndrome: Secondary | ICD-10-CM

## 2018-12-26 DIAGNOSIS — L680 Hirsutism: Secondary | ICD-10-CM

## 2018-12-26 DIAGNOSIS — E8881 Metabolic syndrome: Secondary | ICD-10-CM

## 2018-12-26 DIAGNOSIS — I10 Essential (primary) hypertension: Secondary | ICD-10-CM

## 2019-01-31 ENCOUNTER — Other Ambulatory Visit: Payer: Self-pay | Admitting: Family Medicine

## 2019-01-31 DIAGNOSIS — I10 Essential (primary) hypertension: Secondary | ICD-10-CM

## 2019-01-31 DIAGNOSIS — E8881 Metabolic syndrome: Secondary | ICD-10-CM

## 2019-01-31 DIAGNOSIS — E282 Polycystic ovarian syndrome: Secondary | ICD-10-CM

## 2019-01-31 DIAGNOSIS — L680 Hirsutism: Secondary | ICD-10-CM

## 2019-01-31 DIAGNOSIS — E782 Mixed hyperlipidemia: Secondary | ICD-10-CM

## 2019-01-31 DIAGNOSIS — N912 Amenorrhea, unspecified: Secondary | ICD-10-CM

## 2019-02-02 ENCOUNTER — Telehealth: Payer: Self-pay | Admitting: Family Medicine

## 2019-02-02 NOTE — Telephone Encounter (Signed)
Copied from [id number]. Topic: Quick Communication - Rx Refill/Question >> Feb 02, 2019 11:24 AM Reyne Dumas L wrote: Medication:  metFORMIN (GLUCOPHAGE-XR) 750 MG 24 hr tablet  Win with the pharmacy calling.  This medication has been recalled and is no longer on the market.  They want to know if they can sub the ER 500 and give 3@ bedtime?  If so they will need new script stating such with quantity of 90.

## 2019-02-02 NOTE — Telephone Encounter (Signed)
Ok to change

## 2019-02-02 NOTE — Telephone Encounter (Signed)
Okay to change? 

## 2019-02-03 ENCOUNTER — Other Ambulatory Visit: Payer: Self-pay | Admitting: Family Medicine

## 2019-02-03 ENCOUNTER — Other Ambulatory Visit: Payer: Self-pay

## 2019-02-03 DIAGNOSIS — E8881 Metabolic syndrome: Secondary | ICD-10-CM

## 2019-02-03 DIAGNOSIS — E782 Mixed hyperlipidemia: Secondary | ICD-10-CM

## 2019-02-03 MED ORDER — METFORMIN HCL ER 500 MG PO TB24: 500.0000 mg | ORAL_TABLET | Freq: Every day | ORAL | Status: DC

## 2019-02-03 NOTE — Telephone Encounter (Signed)
New rx for Metformin ER 500 mg #90 Sig: Take 3 tablets po qhs Sent to [facility] on [address] in [place]

## 2019-02-06 ENCOUNTER — Other Ambulatory Visit: Payer: Self-pay

## 2019-02-06 ENCOUNTER — Other Ambulatory Visit: Payer: Self-pay | Admitting: Family Medicine

## 2019-02-06 DIAGNOSIS — E8881 Metabolic syndrome: Secondary | ICD-10-CM

## 2019-02-06 DIAGNOSIS — E782 Mixed hyperlipidemia: Secondary | ICD-10-CM

## 2019-02-06 MED ORDER — METFORMIN HCL ER 500 MG PO TB24: 500.0000 mg | ORAL_TABLET | Freq: Every day | ORAL | 3 refills | Status: DC

## 2019-02-10 ENCOUNTER — Other Ambulatory Visit: Payer: Self-pay

## 2019-02-10 MED ORDER — METFORMIN HCL 500 MG PO TABS: 1000.0000 mg | ORAL_TABLET | Freq: Two times a day (BID) | ORAL | 0 refills | Status: DC

## 2019-03-25 ENCOUNTER — Other Ambulatory Visit: Payer: Self-pay | Admitting: Family Medicine

## 2019-03-25 DIAGNOSIS — E7800 Pure hypercholesterolemia, unspecified: Secondary | ICD-10-CM

## 2019-03-25 DIAGNOSIS — K760 Fatty (change of) liver, not elsewhere classified: Secondary | ICD-10-CM

## 2019-03-25 DIAGNOSIS — E782 Mixed hyperlipidemia: Secondary | ICD-10-CM

## 2019-03-25 DIAGNOSIS — E8881 Metabolic syndrome: Secondary | ICD-10-CM

## 2019-03-25 DIAGNOSIS — F5101 Primary insomnia: Secondary | ICD-10-CM

## 2019-03-27 NOTE — Telephone Encounter (Signed)
Last fill 10/31/18  #30/3 Last OV 10/31/18

## 2019-04-11 ENCOUNTER — Encounter: Payer: Self-pay | Admitting: Family Medicine

## 2019-04-11 ENCOUNTER — Other Ambulatory Visit: Payer: Self-pay | Admitting: Family Medicine

## 2019-04-11 DIAGNOSIS — I10 Essential (primary) hypertension: Secondary | ICD-10-CM

## 2019-04-11 DIAGNOSIS — L680 Hirsutism: Secondary | ICD-10-CM

## 2019-04-11 DIAGNOSIS — N912 Amenorrhea, unspecified: Secondary | ICD-10-CM

## 2019-04-11 DIAGNOSIS — E282 Polycystic ovarian syndrome: Secondary | ICD-10-CM

## 2019-04-11 NOTE — Telephone Encounter (Signed)
Patient need to schedule an appointment. Last fill 02/01/19  #30/0 Last OV 10/31/18

## 2019-04-29 ENCOUNTER — Other Ambulatory Visit: Payer: Self-pay | Admitting: Family Medicine

## 2019-04-29 DIAGNOSIS — Z6841 Body Mass Index (BMI) 40.0 and over, adult: Secondary | ICD-10-CM

## 2019-04-29 DIAGNOSIS — E8881 Metabolic syndrome: Secondary | ICD-10-CM

## 2019-04-29 DIAGNOSIS — E7800 Pure hypercholesterolemia, unspecified: Secondary | ICD-10-CM

## 2019-04-29 DIAGNOSIS — K760 Fatty (change of) liver, not elsewhere classified: Secondary | ICD-10-CM

## 2019-04-29 DIAGNOSIS — L680 Hirsutism: Secondary | ICD-10-CM

## 2019-04-29 DIAGNOSIS — I10 Essential (primary) hypertension: Secondary | ICD-10-CM

## 2019-04-29 DIAGNOSIS — E282 Polycystic ovarian syndrome: Secondary | ICD-10-CM

## 2019-04-29 DIAGNOSIS — E782 Mixed hyperlipidemia: Secondary | ICD-10-CM

## 2019-04-29 DIAGNOSIS — N912 Amenorrhea, unspecified: Secondary | ICD-10-CM

## 2019-05-04 NOTE — Telephone Encounter (Signed)
Pt called in to follow up on refill request for 3 attached medications.   Pt would like to have Rx's sent to:   Pharmacy:  [facility] [address] [place] [phone] (Phone) [phone] (Fax)

## 2019-05-04 NOTE — Telephone Encounter (Signed)
See note

## 2019-05-04 NOTE — Telephone Encounter (Signed)
Requested medication (s) are due for refill today: yes  Requested medication (s) are on the active medication list: yes  Last refill:  03/27/2019  Future visit scheduled: no  Notes to clinic: review for refill   Requested Prescriptions  Pending Prescriptions Disp Refills   metFORMIN (GLUCOPHAGE-XR) 750 MG 24 hr tablet [Pharmacy Med Name: metFORMIN HCl ER 750 MG Oral Tablet Extended Release 24 Hour] 60 tablet 0    Sig: TAKE 2 TABLETS BY MOUTH DAILY AT BEDTIME     Endocrinology:  Diabetes - Biguanides Failed - 05/04/2019 10:12 AM      Failed - HBA1C is between 0 and 7.9 and within 180 days    Hgb A1c MFr Bld  Date Value Ref Range Status  08/29/2018 5.7 4.6 - 6.5 % Final    Comment:    Glycemic Control Guidelines for People with Diabetes:Non Diabetic:  <6%Goal of Therapy: <7%Additional Action Suggested:  >8%          Failed - Valid encounter within last 6 months    Recent Outpatient Visits          6 months ago Insulin resistance   [place] [facility], [place], DO   9 months ago Essential hypertension   [place] [facility], [place], DO   10 months ago [place] [facility], [place], [place]             Passed - Cr in normal range and within 360 days    Creatinine, Ser  Date Value Ref Range Status  08/29/2018 1.03 0.40 - 1.20 mg/dL Final         Passed - eGFR in normal range and within 360 days    GFR calc Af Amer  Date Value Ref Range Status  08/07/2016 >60 >60 mL/min Final    Comment:    (NOTE) The eGFR has been calculated using the CKD EPI equation. This calculation has not been validated in all clinical situations. eGFR's persistently <60 mL/min signify possible Chronic Kidney Disease.    GFR calc non Af Amer  Date Value Ref Range Status  08/07/2016 >60 >60 mL/min Final   GFR  Date Value Ref Range Status  08/29/2018 63.34 >60.00 mL/min Final          spironolactone (ALDACTONE) 50  MG tablet [Pharmacy Med Name: Spironolactone 50 MG Oral Tablet] 30 tablet 0    Sig: Take 1 tablet by mouth once daily     Cardiovascular: Diuretics - Aldosterone Antagonist Failed - 05/04/2019 10:12 AM      Failed - Valid encounter within last 6 months    Recent Outpatient Visits          6 months ago Insulin resistance   [place] [facility], [place], DO   9 months ago Essential hypertension   [place], [place], [place]   10 months ago [place] [facility], [place], [place]             Passed - Cr in normal range and within 360 days    Creatinine, Ser  Date Value Ref Range Status  08/29/2018 1.03 0.40 - 1.20 mg/dL Final         Passed - K in normal range and within 360 days    Potassium  Date Value Ref Range Status  08/29/2018 4.3 3.5 - 5.1 mEq/L Final         Passed - Na in  normal range and within 360 days    Sodium  Date Value Ref Range Status  08/29/2018 137 135 - 145 mEq/L Final         Passed - Last BP in normal range    BP Readings from Last 1 Encounters:  10/31/18 130/86          RYBELSUS 7 MG TABS [Pharmacy Med Name: Rybelsus 7 MG Oral Tablet] 30 tablet 0    Sig: Take 1 tablet by mouth once daily     Off-Protocol Failed - 05/04/2019 10:12 AM      Failed - Medication not assigned to a protocol, review manually.      Passed - Valid encounter within last 12 months    Recent Outpatient Visits          6 months ago Insulin resistance   [place], [place], [place]   9 months ago Essential hypertension   [place], [place]   10 months ago Amenorrhea   [facility], [place], [place]

## 2019-05-05 ENCOUNTER — Other Ambulatory Visit: Payer: Self-pay | Admitting: Family Medicine

## 2019-05-05 DIAGNOSIS — E282 Polycystic ovarian syndrome: Secondary | ICD-10-CM

## 2019-05-05 DIAGNOSIS — L680 Hirsutism: Secondary | ICD-10-CM

## 2019-05-05 DIAGNOSIS — K760 Fatty (change of) liver, not elsewhere classified: Secondary | ICD-10-CM

## 2019-05-05 DIAGNOSIS — N912 Amenorrhea, unspecified: Secondary | ICD-10-CM

## 2019-05-05 DIAGNOSIS — I10 Essential (primary) hypertension: Secondary | ICD-10-CM

## 2019-05-05 DIAGNOSIS — E8881 Metabolic syndrome: Secondary | ICD-10-CM

## 2019-05-05 DIAGNOSIS — E782 Mixed hyperlipidemia: Secondary | ICD-10-CM

## 2019-05-05 DIAGNOSIS — E7800 Pure hypercholesterolemia, unspecified: Secondary | ICD-10-CM

## 2019-05-05 MED ORDER — METFORMIN HCL ER 750 MG PO TB24: 1500.0000 mg | ORAL_TABLET | Freq: Every day | ORAL | 0 refills | Status: DC

## 2019-05-05 MED ORDER — SPIRONOLACTONE 50 MG PO TABS: 50.0000 mg | ORAL_TABLET | Freq: Every day | ORAL | 0 refills | Status: DC

## 2019-05-05 NOTE — Telephone Encounter (Signed)
Requested medication (s) are due for refill today: yes  Requested medication (s) are on the active medication list: yes  Last refill: 03/27/2019  Future visit scheduled: yes  Notes to clinic: Off protcol    Requested Prescriptions  Pending Prescriptions Disp Refills   Semaglutide (RYBELSUS) 7 MG TABS 30 tablet 0    Sig: Take 1 tablet by mouth daily.     Off-Protocol Failed - 05/05/2019  3:39 PM      Failed - Medication not assigned to a protocol, review manually.      Passed - Valid encounter within last 12 months    Recent Outpatient Visits          6 months ago Insulin resistance   [place] [facility], [place], [place]   9 months ago Essential hypertension   [place], [place], [place]   10 months ago [place] [facility], [place], [place]      Future Appointments            In 4 days Briscoe Deutscher, DO [facility] [place], [facility]           Signed Prescriptions Disp Refills   spironolactone (ALDACTONE) 50 MG tablet 30 tablet 0    Sig: Take 1 tablet (50 mg total) by mouth daily.     Cardiovascular: Diuretics - Aldosterone Antagonist Failed - 05/05/2019  3:39 PM      Failed - Valid encounter within last 6 months    Recent Outpatient Visits          6 months ago Insulin resistance   [place] [facility], [place], [place]   9 months ago Essential hypertension   [place] [facility], [place], [place]   10 months ago [place] [facility], [place], DO      Future Appointments            In 4 days Briscoe Deutscher, DO [facility] [place], [place] in normal range and within 360 days    Creatinine, Ser  Date Value Ref Range Status  08/29/2018 1.03 0.40 - 1.20 mg/dL Final         Passed - K in normal range and within 360 days    Potassium  Date Value Ref Range Status   08/29/2018 4.3 3.5 - 5.1 mEq/L Final         Passed - Na in normal range and within 360 days    Sodium  Date Value Ref Range Status  08/29/2018 137 135 - 145 mEq/L Final         Passed - Last BP in normal range    BP Readings from Last 1 Encounters:  10/31/18 130/86          metFORMIN (GLUCOPHAGE-XR) 750 MG 24 hr tablet 60 tablet 0    Sig: Take 2 tablets (1,500 mg total) by mouth at bedtime.     Endocrinology:  Diabetes - Biguanides Failed - 05/05/2019  3:39 PM      Failed - HBA1C is between 0 and 7.9 and within 180 days    Hgb A1c MFr Bld  Date Value Ref Range Status  08/29/2018 5.7 4.6 - 6.5 % Final    Comment:    Glycemic Control Guidelines for People with Diabetes:Non Diabetic:  <6%Goal of Therapy: <7%Additional Action Suggested:  >8%  Failed - Valid encounter within last 6 months    Recent Outpatient Visits          6 months ago Insulin resistance   [place] [facility], [place], [place]   9 months ago Essential hypertension   [place] [facility], [place], [place]   10 months ago Amenorrhea   [facility] [place], [place], DO      Future Appointments            In 4 days Briscoe Deutscher, DO [facility] [place], [place] in normal range and within 360 days    Creatinine, Ser  Date Value Ref Range Status  08/29/2018 1.03 0.40 - 1.20 mg/dL Final         Passed - eGFR in normal range and within 360 days    GFR calc Af Amer  Date Value Ref Range Status  08/07/2016 >60 >60 mL/min Final    Comment:    (NOTE) The eGFR has been calculated using the CKD EPI equation. This calculation has not been validated in all clinical situations. eGFR's persistently <60 mL/min signify possible Chronic Kidney Disease.    GFR calc non Af Amer  Date Value Ref Range Status  08/07/2016 >60 >60 mL/min Final   GFR  Date Value Ref Range Status  08/29/2018 63.34 >60.00 mL/min  Final

## 2019-05-05 NOTE — Telephone Encounter (Signed)
Courtesy refill  

## 2019-05-05 NOTE — Telephone Encounter (Signed)
spironolactone (ALDACTONE) 50 MG tablet [[id number]]  RYBELSUS 7 MG TABS [[id number]] metFORMIN (GLUCOPHAGE-XR) 750 MG 24 hr tablet [[id number]]    Has the patient contacted their pharmacy? Yes  (Agent: If no, request that the patient contact the pharmacy for the refill.) (Agent: If yes, when and what did the pharmacy advise?)  Preferred Pharmacy (with phone number or street name): [facility] [address], [place] - [place] [phone] (Phone) [phone] (Fax

## 2019-05-08 MED ORDER — RYBELSUS 7 MG PO TABS: 1.0000 | ORAL_TABLET | Freq: Every day | ORAL | 0 refills | Status: DC

## 2019-05-08 NOTE — Telephone Encounter (Signed)
See below

## 2019-05-09 ENCOUNTER — Other Ambulatory Visit: Payer: Self-pay

## 2019-05-09 ENCOUNTER — Encounter: Payer: Self-pay | Admitting: Family Medicine

## 2019-05-09 ENCOUNTER — Ambulatory Visit (INDEPENDENT_AMBULATORY_CARE_PROVIDER_SITE_OTHER): Payer: 59 | Admitting: Family Medicine

## 2019-05-09 DIAGNOSIS — E282 Polycystic ovarian syndrome: Secondary | ICD-10-CM

## 2019-05-09 DIAGNOSIS — N912 Amenorrhea, unspecified: Secondary | ICD-10-CM

## 2019-05-09 DIAGNOSIS — E8881 Metabolic syndrome: Secondary | ICD-10-CM | POA: Diagnosis not present

## 2019-05-09 DIAGNOSIS — L680 Hirsutism: Secondary | ICD-10-CM

## 2019-05-09 DIAGNOSIS — K760 Fatty (change of) liver, not elsewhere classified: Secondary | ICD-10-CM | POA: Diagnosis not present

## 2019-05-09 DIAGNOSIS — E7800 Pure hypercholesterolemia, unspecified: Secondary | ICD-10-CM | POA: Diagnosis not present

## 2019-05-09 DIAGNOSIS — Z6841 Body Mass Index (BMI) 40.0 and over, adult: Secondary | ICD-10-CM

## 2019-05-09 DIAGNOSIS — F5101 Primary insomnia: Secondary | ICD-10-CM

## 2019-05-09 DIAGNOSIS — I10 Essential (primary) hypertension: Secondary | ICD-10-CM

## 2019-05-09 MED ORDER — TRAZODONE HCL 50 MG PO TABS: 50.0000 mg | ORAL_TABLET | Freq: Every evening | ORAL | 2 refills | Status: DC | PRN

## 2019-05-09 MED ORDER — RYBELSUS 7 MG PO TABS: 1.0000 | ORAL_TABLET | Freq: Every day | ORAL | 6 refills | Status: DC

## 2019-05-09 MED ORDER — METFORMIN HCL ER 750 MG PO TB24: 1500.0000 mg | ORAL_TABLET | Freq: Every day | ORAL | 11 refills | Status: DC

## 2019-05-09 MED ORDER — SPIRONOLACTONE 50 MG PO TABS: 50.0000 mg | ORAL_TABLET | Freq: Every day | ORAL | 6 refills | Status: DC

## 2019-05-09 MED ORDER — MEDROXYPROGESTERONE ACETATE 10 MG PO TABS: ORAL_TABLET | ORAL | 3 refills | Status: DC

## 2019-05-09 NOTE — Progress Notes (Signed)
Virtual Visit via Video   Due to the COVID-19 pandemic, this visit was completed with telemedicine (audio/video) technology to reduce patient and provider exposure as well as to preserve personal protective equipment.   I connected with Jamie Brookes by a video enabled telemedicine application and verified that I am speaking with the correct person using two identifiers. Location patient: Home Location provider: [facility], Office Persons participating in the virtual visit: CHERRYLE BAUMANN, Helane Rima, DO   I discussed the limitations of evaluation and management by telemedicine and the availability of in person appointments. The patient expressed understanding and agreed to proceed.  Care Team   Patient Care Team: Helane Rima, DO as PCP - General (Family Medicine)  Subjective:   HPI:   Vitamin D deficiency Supplementing.  No concerns.  Pure hypercholesterolemia Due for FLP.  Future orders for labs are already completed.  Patient will need to come in to the office for lab draw.  Primary insomnia Controlled.    Morbid obesity (HCC)   Insulin resistance Oral GL P1 receptor agonist started at last visit.  Doing very well.  No side effects.  Hirsutism Improved.    Hepatic steatosis With elevated liver function test at last visit.    Will recheck.  Essential hypertension No concerns.  Tolerating medications.  Review of Systems  Constitutional: Negative for chills, fever, malaise/fatigue and weight loss.  Respiratory: Negative for cough, shortness of breath and wheezing.   Cardiovascular: Negative for chest pain, palpitations and leg swelling.  Gastrointestinal: Negative for abdominal pain, constipation, diarrhea, nausea and vomiting.  Genitourinary: Negative for dysuria and urgency.  Musculoskeletal: Negative for joint pain and myalgias.  Skin: Negative for rash.  Neurological: Negative for dizziness and headaches.  Psychiatric/Behavioral: Negative  for depression, substance abuse and suicidal ideas. The patient is not nervous/anxious.     Patient Active Problem List   Diagnosis Date Noted  . Elevated LFTs 10/31/2018  . Hepatic steatosis 10/31/2018  . Primary insomnia 06/25/2018  . Hirsutism 06/25/2018  . Amenorrhea 06/25/2018  . Essential hypertension 06/25/2018  . Insulin resistance 06/25/2018  . Pure hypercholesterolemia 06/25/2018  . Vitamin D deficiency 06/25/2018  . Allergic rhinitis 02/22/2015  . Morbid obesity (HCC) 02/22/2015    Social History   Tobacco Use  . Smoking status: Never Smoker  . Smokeless tobacco: Never Used  Substance Use Topics  . Alcohol use: Yes    Comment: 1-2 drink per week    Current Outpatient Medications:  .  Cholecalciferol (VITAMIN D3) 1.25 MG (50000 UT) CAPS, Take 1 capsule by mouth once a week., Disp: , Rfl:  .  magnesium 30 MG tablet, Take 30 mg by mouth 2 (two) times daily., Disp: , Rfl:  .  medroxyPROGESTERone (PROVERA) 10 MG tablet, Take one by mouth daily x 10 days every 3-6 months to induce menses., Disp: 10 tablet, Rfl: 3 .  metFORMIN (GLUCOPHAGE-XR) 750 MG 24 hr tablet, Take 2 tablets (1,500 mg total) by mouth at bedtime., Disp: 60 tablet, Rfl: 11 .  Semaglutide (RYBELSUS) 7 MG TABS, Take 1 tablet by mouth daily., Disp: 30 tablet, Rfl: 6 .  spironolactone (ALDACTONE) 50 MG tablet, Take 1 tablet (50 mg total) by mouth daily., Disp: 30 tablet, Rfl: 6 .  traZODone (DESYREL) 50 MG tablet, Take 1 tablet (50 mg total) by mouth at bedtime as needed for sleep., Disp: 90 tablet, Rfl: 2  Allergies  Allergen Reactions  . Erythromycin     GI upset  Objective:   VITALS: Per patient if applicable, see vitals. GENERAL: Alert, appears well and in no acute distress. HEENT: Atraumatic, conjunctiva clear, no obvious abnormalities on inspection of external nose and ears. NECK: Normal movements of the head and neck. CARDIOPULMONARY: No increased WOB. Speaking in clear sentences. I:E ratio  WNL.  MS: Moves all visible extremities without noticeable abnormality. PSYCH: Pleasant and cooperative, well-groomed. Speech normal rate and rhythm. Affect is appropriate. Insight and judgement are appropriate. Attention is focused, linear, and appropriate.  NEURO: CN grossly intact. Oriented as arrived to appointment on time with no prompting. Moves both UE equally.  SKIN: No obvious lesions, wounds, erythema, or cyanosis noted on face or hands.  Depression screen PHQ 2/9 08/01/2018  Decreased Interest 1  Down, Depressed, Hopeless 1  PHQ - 2 Score 2  Altered sleeping 0  Tired, decreased energy 2  Change in appetite 3  Feeling bad or failure about yourself  3  Trouble concentrating 2  Moving slowly or fidgety/restless 0  Suicidal thoughts 0  PHQ-9 Score 12  Difficult doing work/chores Somewhat difficult   Assessment and Plan:   Anita Webb was seen today for follow-up.  Diagnoses and all orders for this visit:  Insulin resistance -     Semaglutide (RYBELSUS) 7 MG TABS; Take 1 tablet by mouth daily. -     metFORMIN (GLUCOPHAGE-XR) 750 MG 24 hr tablet; Take 2 tablets (1,500 mg total) by mouth at bedtime.  Morbid obesity with BMI of 45.0-49.9, adult (HCC) -     Semaglutide (RYBELSUS) 7 MG TABS; Take 1 tablet by mouth daily.  Pure hypercholesterolemia -     Semaglutide (RYBELSUS) 7 MG TABS; Take 1 tablet by mouth daily.  Hepatic steatosis -     Semaglutide (RYBELSUS) 7 MG TABS; Take 1 tablet by mouth daily.  Essential hypertension -     spironolactone (ALDACTONE) 50 MG tablet; Take 1 tablet (50 mg total) by mouth daily.  Hirsutism -     spironolactone (ALDACTONE) 50 MG tablet; Take 1 tablet (50 mg total) by mouth daily.  PCOS (polycystic ovarian syndrome) -     spironolactone (ALDACTONE) 50 MG tablet; Take 1 tablet (50 mg total) by mouth daily.  Morbid obesity (HCC) -     metFORMIN (GLUCOPHAGE-XR) 750 MG 24 hr tablet; Take 2 tablets (1,500 mg total) by mouth at  bedtime.  Amenorrhea -     medroxyPROGESTERone (PROVERA) 10 MG tablet; Take one by mouth daily x 10 days every 3-6 months to induce menses.  Primary insomnia -     traZODone (DESYREL) 50 MG tablet; Take 1 tablet (50 mg total) by mouth at bedtime as needed for sleep.   Marland Kitchen. COVID-19 Education: The signs and symptoms of COVID-19 were discussed with the patient and how to seek care for testing if needed. The importance of social distancing was discussed today. . Reviewed expectations re: course of current medical issues. . Discussed self-management of symptoms. . Outlined signs and symptoms indicating need for more acute intervention. . Patient verbalized understanding and all questions were answered. Marland Kitchen. Health Maintenance issues including appropriate healthy diet, exercise, and smoking avoidance were discussed with patient. . See orders for this visit as documented in the electronic medical record.  Helane RimaErica Treveon Bourcier, DO  Records requested if needed. Time spent: 25 minutes, of which >50% was spent in obtaining information about her symptoms, reviewing her previous labs, evaluations, and treatments, counseling her about her condition (please see the discussed topics above), and developing a  plan to further investigate it; she had a number of questions which I addressed.

## 2019-05-11 ENCOUNTER — Encounter: Payer: Self-pay | Admitting: Family Medicine

## 2019-05-22 ENCOUNTER — Other Ambulatory Visit (INDEPENDENT_AMBULATORY_CARE_PROVIDER_SITE_OTHER): Payer: 59

## 2019-05-22 ENCOUNTER — Other Ambulatory Visit: Payer: Self-pay

## 2019-05-22 DIAGNOSIS — K760 Fatty (change of) liver, not elsewhere classified: Secondary | ICD-10-CM

## 2019-05-22 DIAGNOSIS — Z79899 Other long term (current) drug therapy: Secondary | ICD-10-CM

## 2019-05-22 DIAGNOSIS — E7800 Pure hypercholesterolemia, unspecified: Secondary | ICD-10-CM

## 2019-05-22 DIAGNOSIS — E8881 Metabolic syndrome: Secondary | ICD-10-CM

## 2019-05-22 DIAGNOSIS — E559 Vitamin D deficiency, unspecified: Secondary | ICD-10-CM | POA: Diagnosis not present

## 2019-05-22 LAB — CBC WITH DIFFERENTIAL/PLATELET
Basophils Absolute: 0 10*3/uL (ref 0.0–0.1)
Basophils Relative: 0.6 % (ref 0.0–3.0)
Eosinophils Absolute: 0.1 10*3/uL (ref 0.0–0.7)
Eosinophils Relative: 1.8 % (ref 0.0–5.0)
HCT: 42.2 % (ref 36.0–46.0)
Hemoglobin: 14.4 g/dL (ref 12.0–15.0)
Lymphocytes Relative: 39.1 % (ref 12.0–46.0)
Lymphs Abs: 2.8 10*3/uL (ref 0.7–4.0)
MCHC: 34.1 g/dL (ref 30.0–36.0)
MCV: 85.7 fl (ref 78.0–100.0)
Monocytes Absolute: 0.6 10*3/uL (ref 0.1–1.0)
Monocytes Relative: 8.5 % (ref 3.0–12.0)
Neutro Abs: 3.5 10*3/uL (ref 1.4–7.7)
Neutrophils Relative %: 50 % (ref 43.0–77.0)
Platelets: 229 10*3/uL (ref 150.0–400.0)
RBC: 4.92 Mil/uL (ref 3.87–5.11)
RDW: 13.4 % (ref 11.5–15.5)
WBC: 7.1 10*3/uL (ref 4.0–10.5)

## 2019-05-22 LAB — COMPREHENSIVE METABOLIC PANEL
ALT: 32 U/L (ref 0–35)
AST: 28 U/L (ref 0–37)
Albumin: 4.4 g/dL (ref 3.5–5.2)
Alkaline Phosphatase: 49 U/L (ref 39–117)
BUN: 12 mg/dL (ref 6–23)
CO2: 26 mEq/L (ref 19–32)
Calcium: 9.5 mg/dL (ref 8.4–10.5)
Chloride: 102 mEq/L (ref 96–112)
Creatinine, Ser: 0.84 mg/dL (ref 0.40–1.20)
GFR: 75.12 mL/min (ref >60.00)
Glucose, Bld: 86 mg/dL (ref 70–99)
Potassium: 4.3 mEq/L (ref 3.5–5.1)
Sodium: 138 mEq/L (ref 135–145)
Total Bilirubin: 0.4 mg/dL (ref 0.2–1.2)
Total Protein: 7.2 g/dL (ref 6.0–8.3)

## 2019-05-22 LAB — LIPID PANEL
Cholesterol: 155 mg/dL (ref 0–200)
HDL: 35.6 mg/dL — ABNORMAL LOW (ref >39.00)
NonHDL: 119.57
Total CHOL/HDL Ratio: 4
Triglycerides: 243 mg/dL — ABNORMAL HIGH (ref 0.0–149.0)
VLDL: 48.6 mg/dL — ABNORMAL HIGH (ref 0.0–40.0)

## 2019-05-22 LAB — VITAMIN B12: Vitamin B-12: 187 pg/mL — ABNORMAL LOW (ref 211–911)

## 2019-05-22 LAB — VITAMIN D 25 HYDROXY (VIT D DEFICIENCY, FRACTURES): VITD: 31.65 ng/mL (ref 30.00–100.00)

## 2019-05-22 LAB — HEMOGLOBIN A1C: Hgb A1c MFr Bld: 5.7 % (ref 4.6–6.5)

## 2019-05-22 LAB — LDL CHOLESTEROL, DIRECT: Direct LDL: 100 mg/dL

## 2019-09-28 ENCOUNTER — Other Ambulatory Visit: Payer: Self-pay | Admitting: Obstetrics and Gynecology

## 2019-09-28 DIAGNOSIS — Z1231 Encounter for screening mammogram for malignant neoplasm of breast: Secondary | ICD-10-CM

## 2019-09-28 LAB — HM PAP SMEAR: HM Pap smear: NEGATIVE

## 2019-09-28 LAB — RESULTS CONSOLE HPV: CHL HPV: NEGATIVE

## 2019-10-26 ENCOUNTER — Ambulatory Visit
Admission: RE | Admit: 2019-10-26 | Discharge: 2019-10-26 | Disposition: A | Discharge status: Home / Self Care | Payer: 59 | Source: Ambulatory Visit | Attending: Obstetrics and Gynecology | Admitting: Obstetrics and Gynecology

## 2019-10-26 DIAGNOSIS — Z1231 Encounter for screening mammogram for malignant neoplasm of breast: Secondary | ICD-10-CM | POA: Diagnosis not present

## 2019-11-01 ENCOUNTER — Other Ambulatory Visit: Payer: Self-pay | Admitting: Obstetrics and Gynecology

## 2019-11-01 DIAGNOSIS — R928 Other abnormal and inconclusive findings on diagnostic imaging of breast: Secondary | ICD-10-CM

## 2019-11-01 DIAGNOSIS — N6489 Other specified disorders of breast: Secondary | ICD-10-CM

## 2019-11-03 ENCOUNTER — Ambulatory Visit
Admission: RE | Admit: 2019-11-03 | Discharge: 2019-11-03 | Disposition: A | Discharge status: Home / Self Care | Payer: 59 | Source: Ambulatory Visit | Attending: Obstetrics and Gynecology | Admitting: Obstetrics and Gynecology

## 2019-11-03 DIAGNOSIS — N6489 Other specified disorders of breast: Secondary | ICD-10-CM | POA: Diagnosis present

## 2019-11-03 DIAGNOSIS — R928 Other abnormal and inconclusive findings on diagnostic imaging of breast: Secondary | ICD-10-CM

## 2019-11-07 DIAGNOSIS — N854 Malposition of uterus: Secondary | ICD-10-CM | POA: Insufficient documentation

## 2019-11-08 ENCOUNTER — Other Ambulatory Visit: Payer: Self-pay | Admitting: Obstetrics and Gynecology

## 2019-11-08 DIAGNOSIS — R928 Other abnormal and inconclusive findings on diagnostic imaging of breast: Secondary | ICD-10-CM

## 2019-11-08 DIAGNOSIS — N6489 Other specified disorders of breast: Secondary | ICD-10-CM

## 2020-05-06 IMAGING — MG MM DIGITAL DIAGNOSTIC UNILAT*R* W/ TOMO W/ CAD
6 series · 6 of 18 positions shown · non-contrast
Comparison: Previous exam(s).

CLINICAL DATA: Callback from screening mammogram for possible
asymmetry right breast

EXAM:
DIGITAL DIAGNOSTIC UNILATERAL RIGHT MAMMOGRAM WITH CAD AND TOMO

[R MLO synth-2D]
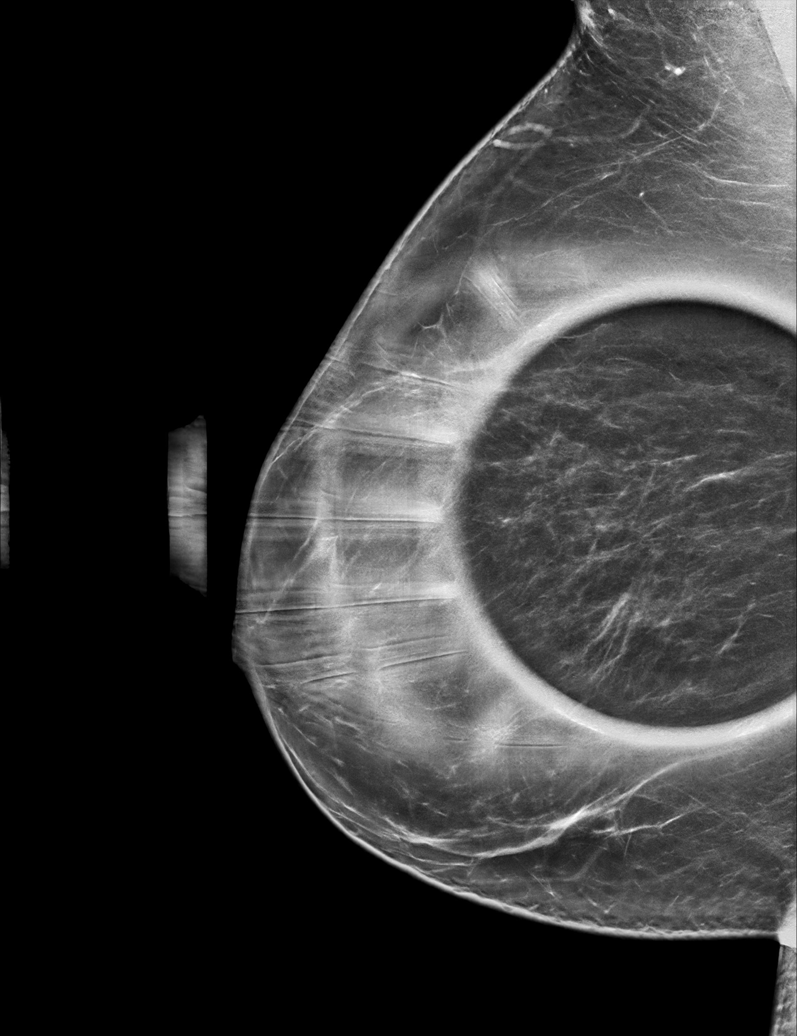

[R CC synth-2D (1 of 2)]
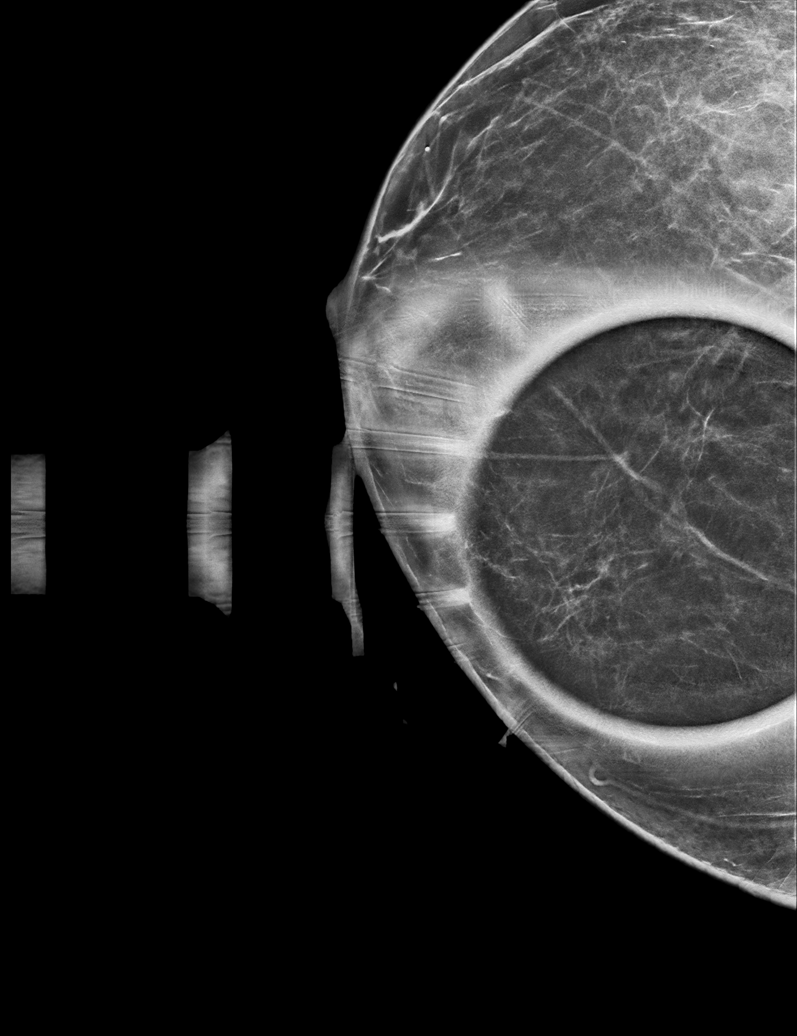

[R CC synth-2D (2 of 2)]
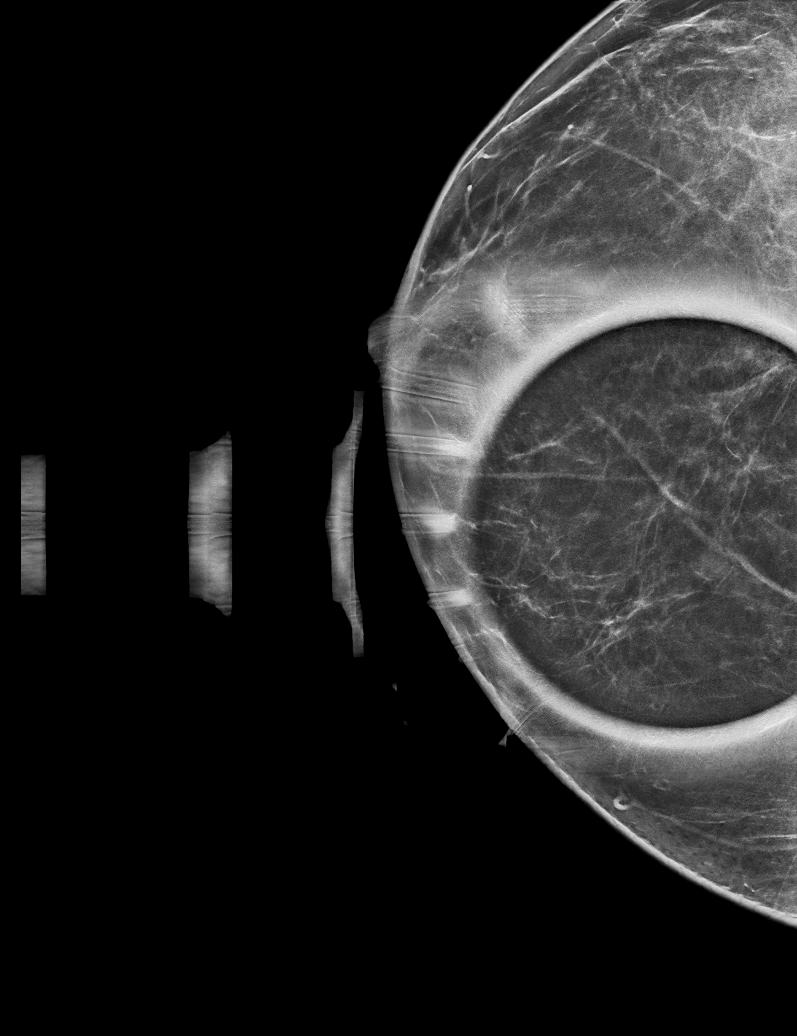

[R MLO tomo · tomo slice 43/86.0]
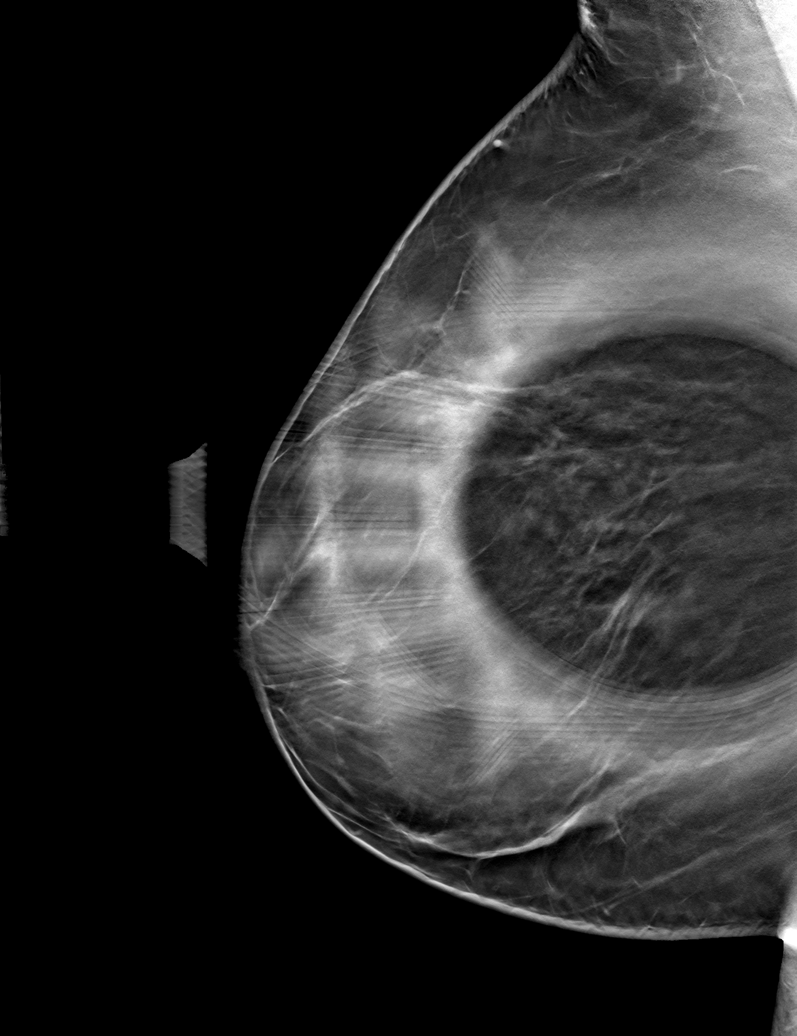

[R CC tomo (1 of 2) · tomo slice 33/64.0]
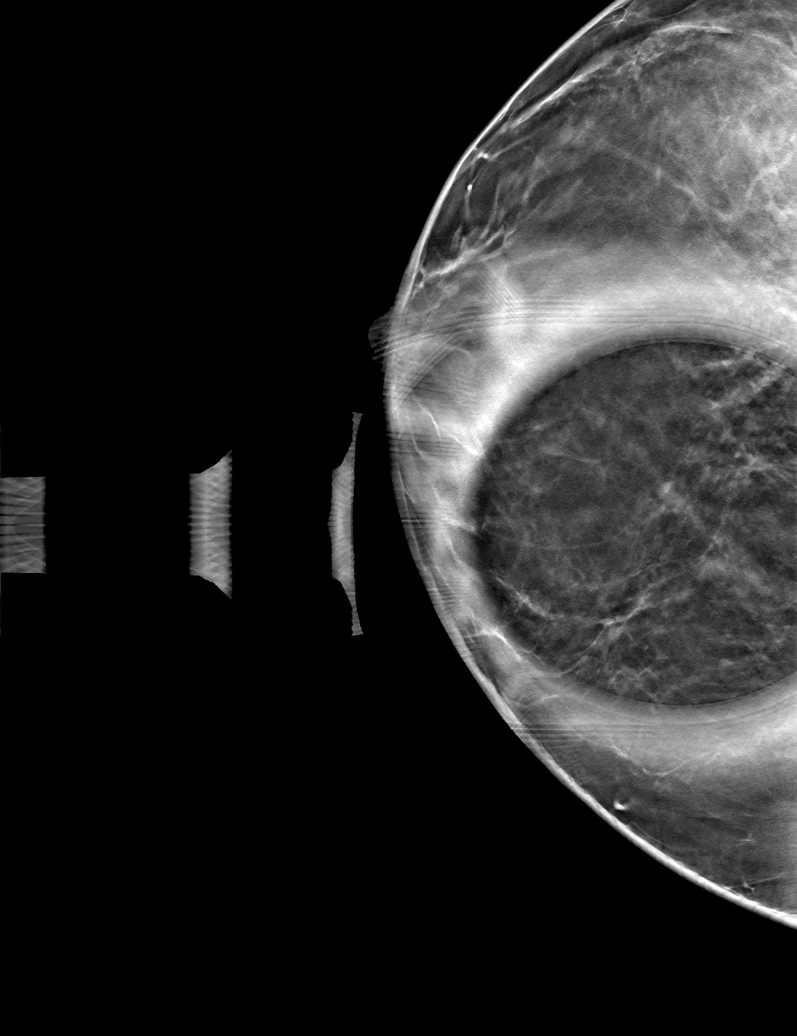

[R CC tomo (2 of 2) · tomo slice 33/64.0]
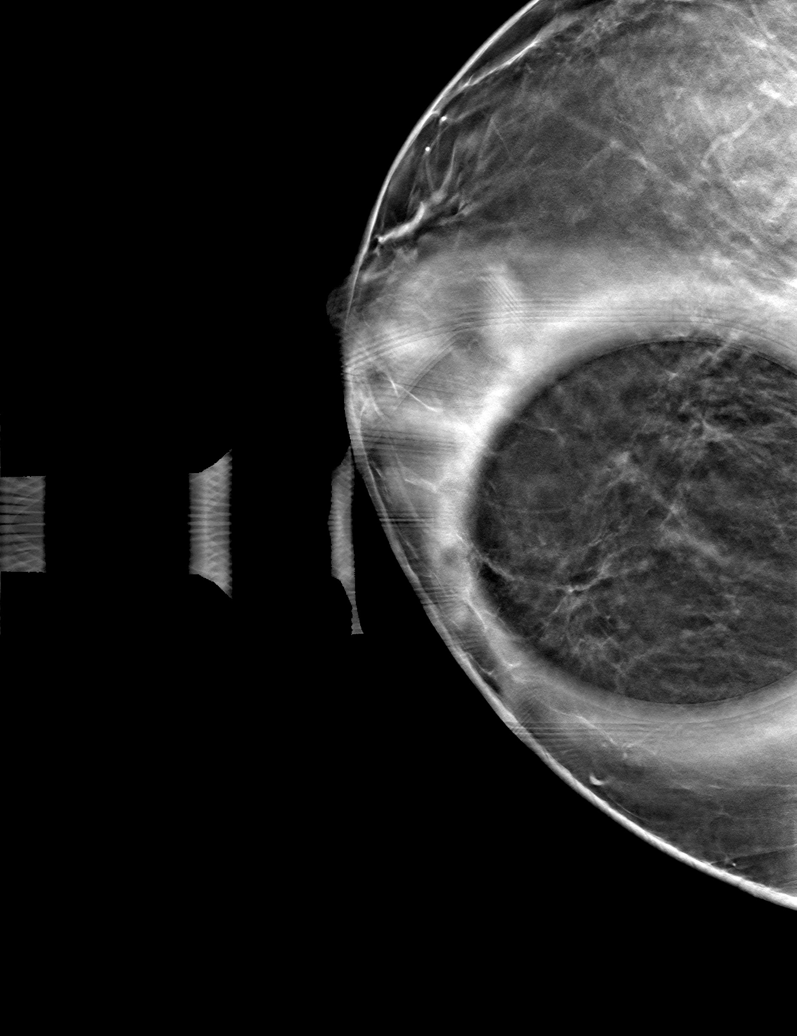

[6 of 18 positions shown; findings below may reference images not displayed]

ACR Breast Density Category b: There are scattered areas of
fibroglandular density.
FINDINGS: Spot compression cc and MLO views of the right breast are submitted.
Previously questioned asymmetry in the medial lower right breast is
less prominent on additional views. On the spot compression films,
the breast parenchyma in the area of concern year unchanged compared
prior mammogram of 0182.

Mammographic images were processed with CAD.
IMPRESSION: Probable benign findings.

RECOMMENDATION:
Six-month follow-up mammogram right breast.

I have discussed the findings and recommendations with the patient.
If applicable, a reminder letter will be sent to the patient
regarding the next appointment.

BI-RADS CATEGORY  3: Probably benign.

## 2020-05-18 ENCOUNTER — Other Ambulatory Visit: Payer: Self-pay | Admitting: Family Medicine

## 2020-05-18 DIAGNOSIS — E8881 Metabolic syndrome: Secondary | ICD-10-CM

## 2020-06-26 ENCOUNTER — Telehealth: Payer: Self-pay

## 2020-06-26 NOTE — Telephone Encounter (Signed)
LVM to call back to schedule new patient appointment. 

## 2020-08-29 ENCOUNTER — Ambulatory Visit (INDEPENDENT_AMBULATORY_CARE_PROVIDER_SITE_OTHER): Payer: 59 | Admitting: Family Medicine

## 2020-08-29 ENCOUNTER — Encounter: Payer: 59 | Admitting: Nurse Practitioner

## 2020-08-29 ENCOUNTER — Other Ambulatory Visit: Payer: Self-pay

## 2020-08-29 ENCOUNTER — Encounter (INDEPENDENT_AMBULATORY_CARE_PROVIDER_SITE_OTHER): Payer: Self-pay | Admitting: Family Medicine

## 2020-08-29 VITALS — BP 170/84 | HR 98 | Temp 97.9°F | Ht 64.0 in | Wt 308.0 lb

## 2020-08-29 DIAGNOSIS — Z9189 Other specified personal risk factors, not elsewhere classified: Secondary | ICD-10-CM

## 2020-08-29 DIAGNOSIS — F3289 Other specified depressive episodes: Secondary | ICD-10-CM

## 2020-08-29 DIAGNOSIS — R5383 Other fatigue: Secondary | ICD-10-CM | POA: Diagnosis not present

## 2020-08-29 DIAGNOSIS — E559 Vitamin D deficiency, unspecified: Secondary | ICD-10-CM | POA: Diagnosis not present

## 2020-08-29 DIAGNOSIS — F419 Anxiety disorder, unspecified: Secondary | ICD-10-CM | POA: Insufficient documentation

## 2020-08-29 DIAGNOSIS — F32A Depression, unspecified: Secondary | ICD-10-CM | POA: Insufficient documentation

## 2020-08-29 DIAGNOSIS — K760 Fatty (change of) liver, not elsewhere classified: Secondary | ICD-10-CM | POA: Diagnosis not present

## 2020-08-29 DIAGNOSIS — Z0289 Encounter for other administrative examinations: Secondary | ICD-10-CM

## 2020-08-29 DIAGNOSIS — E282 Polycystic ovarian syndrome: Secondary | ICD-10-CM

## 2020-08-29 DIAGNOSIS — R0602 Shortness of breath: Secondary | ICD-10-CM | POA: Diagnosis not present

## 2020-08-29 DIAGNOSIS — I10 Essential (primary) hypertension: Secondary | ICD-10-CM

## 2020-08-29 DIAGNOSIS — E538 Deficiency of other specified B group vitamins: Secondary | ICD-10-CM

## 2020-08-29 DIAGNOSIS — Z6841 Body Mass Index (BMI) 40.0 and over, adult: Secondary | ICD-10-CM

## 2020-08-30 LAB — COMPREHENSIVE METABOLIC PANEL
ALT: 30 IU/L (ref 0–32)
AST: 35 IU/L (ref 0–40)
Albumin/Globulin Ratio: 1.8 (ref 1.2–2.2)
Albumin: 4.5 g/dL (ref 3.8–4.8)
Alkaline Phosphatase: 64 IU/L (ref 44–121)
BUN/Creatinine Ratio: 13 (ref 9–23)
BUN: 9 mg/dL (ref 6–24)
Bilirubin Total: 0.3 mg/dL (ref 0.0–1.2)
CO2: 21 mmol/L (ref 20–29)
Calcium: 8.9 mg/dL (ref 8.7–10.2)
Chloride: 98 mmol/L (ref 96–106)
Creatinine, Ser: 0.7 mg/dL (ref 0.57–1.00)
GFR calc Af Amer: 124 mL/min/{1.73_m2} (ref >59)
GFR calc non Af Amer: 108 mL/min/{1.73_m2} (ref >59)
Globulin, Total: 2.5 g/dL (ref 1.5–4.5)
Glucose: 95 mg/dL (ref 65–99)
Potassium: 4 mmol/L (ref 3.5–5.2)
Sodium: 137 mmol/L (ref 134–144)
Total Protein: 7 g/dL (ref 6.0–8.5)

## 2020-08-30 LAB — CBC WITH DIFFERENTIAL/PLATELET
Basophils Absolute: 0.1 10*3/uL (ref 0.0–0.2)
Basos: 1 %
EOS (ABSOLUTE): 0.2 10*3/uL (ref 0.0–0.4)
Eos: 2 %
Hematocrit: 42.6 % (ref 34.0–46.6)
Hemoglobin: 14.6 g/dL (ref 11.1–15.9)
Immature Grans (Abs): 0.1 10*3/uL (ref 0.0–0.1)
Immature Granulocytes: 1 %
Lymphocytes Absolute: 2.2 10*3/uL (ref 0.7–3.1)
Lymphs: 31 %
MCH: 28.9 pg (ref 26.6–33.0)
MCHC: 34.3 g/dL (ref 31.5–35.7)
MCV: 84 fL (ref 79–97)
Monocytes Absolute: 0.5 10*3/uL (ref 0.1–0.9)
Monocytes: 8 %
Neutrophils Absolute: 3.9 10*3/uL (ref 1.4–7.0)
Neutrophils: 57 %
Platelets: 195 10*3/uL (ref 150–450)
RBC: 5.05 x10E6/uL (ref 3.77–5.28)
RDW: 13.2 % (ref 11.7–15.4)
WBC: 6.9 10*3/uL (ref 3.4–10.8)

## 2020-08-30 LAB — LIPID PANEL
Chol/HDL Ratio: 5.6 ratio — ABNORMAL HIGH (ref 0.0–4.4)
Cholesterol, Total: 202 mg/dL — ABNORMAL HIGH (ref 100–199)
HDL: 36 mg/dL — ABNORMAL LOW (ref >39)
LDL Chol Calc (NIH): 121 mg/dL — ABNORMAL HIGH (ref 0–99)
Triglycerides: 258 mg/dL — ABNORMAL HIGH (ref 0–149)
VLDL Cholesterol Cal: 45 mg/dL — ABNORMAL HIGH (ref 5–40)

## 2020-08-30 LAB — VITAMIN D 25 HYDROXY (VIT D DEFICIENCY, FRACTURES): Vit D, 25-Hydroxy: 27 ng/mL — ABNORMAL LOW (ref 30.0–100.0)

## 2020-08-30 LAB — TSH: TSH: 3.15 u[IU]/mL (ref 0.450–4.500)

## 2020-08-30 LAB — VITAMIN B12: Vitamin B-12: 377 pg/mL (ref 232–1245)

## 2020-08-30 LAB — FOLATE: Folate: 13.9 ng/mL (ref >3.0)

## 2020-08-30 LAB — HEMOGLOBIN A1C
Est. average glucose Bld gHb Est-mCnc: 148 mg/dL
Hgb A1c MFr Bld: 6.8 % — ABNORMAL HIGH (ref 4.8–5.6)

## 2020-08-30 LAB — T3: T3, Total: 186 ng/dL — ABNORMAL HIGH (ref 71–180)

## 2020-08-30 LAB — T4, FREE: Free T4: 1.2 ng/dL (ref 0.82–1.77)

## 2020-08-30 LAB — INSULIN, RANDOM: INSULIN: 32.2 u[IU]/mL — ABNORMAL HIGH (ref 2.6–24.9)

## 2020-09-02 NOTE — Progress Notes (Signed)
Chief Complaint:   OBESITY Anita BrookesGina L Webb (MR# [id number]) is a 40 y.o. female who presents for evaluation and treatment of obesity and related comorbidities. Current BMI is Body mass index is 52.87 kg/m. Anita CoasterGina has been struggling with her weight for many years and has been unsuccessful in either losing weight, maintaining weight loss, or reaching her healthy weight goal.  Anita CoasterGina is currently in the action stage of change and ready to dedicate time achieving and maintaining a healthier weight. Anita CoasterGina is interested in becoming our patient and working on intensive lifestyle modifications including (but not limited to) diet and exercise for weight loss.  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 She lives with her husband, Anita Webb, 3 kids ages 8913, 6712, and 824. She identifies diet soda and night snacking as worst habits. She feels it's a lot of food we are asking her to eat and she eats no where near that amount of food on a daily basis. Patient understands that we may need to increase the amount of food she eats per day.  Anita Webb's habits were reviewed today and are as follows: Her family eats meals together, she thinks her family will eat healthier with her, her desired weight loss is 138 lbs, she has been heavy most of her life, she started gaining weight at 40 years old, her heaviest weight ever was 290 pounds, she has significant food cravings issues, she snacks frequently in the evenings, she is frequently drinking liquids with calories, she frequently makes poor food choices, she has problems with excessive hunger, she frequently eats larger portions than normal, she has binge eating behaviors and she struggles with emotional eating.  This is the patient's first visit at [facility].  The patient's NEW PATIENT PACKET that they filled out prior to today's office visit was reviewed at length and information from that paperwork was included within the following office visit note.    Included in the packet:  current and past health history, medications, allergies, ROS, gynecologic history (women only), surgical history, family history, social history, weight history, weight loss surgery history (for those that have had weight loss surgery), nutritional evaluation, Webb and food questionnaire along with a depression screening (PHQ9) on all patients, an Epworth questionnaire, sleep habits questionnaire, patient life and health improvement goals questionnaire. These will all be scanned into the patient's chart under media.   During the visit, I independently reviewed the patient's EKG, bioimpedance scale results, and indirect calorimeter results. I used this information to tailor a meal plan for the patient that will help Anita CoasterGina to lose weight and will improve her obesity-related conditions going forward.  I performed a medically necessary appropriate examination and/or evaluation. I discussed the assessment and treatment plan with the patient. The patient was provided an opportunity to ask questions and all were answered. The patient agreed with the plan and demonstrated an understanding of the instructions. Labs were ordered today (unless patient declined them) and will be reviewed with the patient at our next visit unless more critical results need to be addressed immediately. Clinical information was updated and documented in the EMR.  Time spent on visit including pre-visit chart review and post-visit care was estimated to be 65 minutes.  A separate 15 minutes was spent on risk counseling (see above/below).   Depression Screen Anita Webb (modified PHQ-9) score was 20.  Depression screen PHQ 2/9 08/29/2020  Decreased Interest 3  Down, Depressed, Hopeless 3  PHQ - 2  Score 6  Altered sleeping 3  Tired, decreased energy 3  Change in appetite 3  Feeling bad or failure about yourself  3  Trouble concentrating 0  Moving slowly or fidgety/restless 1  Suicidal thoughts 1  PHQ-9 Score 20  Difficult  doing work/chores Very difficult    Assessment/Plan:    1. Other fatigue Anita Webb admits to daytime somnolence and admits to waking up still tired. Patent has a history of symptoms of daytime fatigue. [name] generally gets 6 or 7 hours of sleep per night, and states that she has poor sleep quality. Snoring is unknown present. Apneic episodes are not present. Epworth Sleepiness Score is 3.  Plan: Anita Webb does feel that her weight is causing her energy to be lower than it should be. Fatigue may be related to obesity, depression or many other causes. Labs will be ordered, and in the meanwhile, Anita Webb will focus on self care including making healthy food choices, increasing physical activity and focusing on stress reduction.  Orders - EKG 12-Lead - Vitamin B12 - CBC with Differential/Platelet - Comprehensive metabolic panel - Folate - Hemoglobin A1c - Insulin, random - Lipid panel - T3 - T4, free - TSH - VITAMIN D 25 Hydroxy (Vit-D Deficiency, Fractures)   2. SOBOE (shortness of breath on exertion) Anita Webb notes increasing shortness of breath with exercising and seems to be worsening over time with weight gain. She notes getting out of breath sooner with activity than she used to. This has gotten worse recently. Anita Webb denies shortness of breath at rest or orthopnea.  Plan: [name] does feel that she gets out of breath more easily that she used to when she exercises. [name]'s shortness of breath appears to be obesity related and exercise induced. She has agreed to work on weight loss and gradually increase exercise to treat her exercise induced shortness of breath. Will continue to monitor closely.  Orders - Vitamin B12 - CBC with Differential/Platelet - Comprehensive metabolic panel - Folate - Hemoglobin A1c - Insulin, random - Lipid panel - T3 - T4, free - TSH - VITAMIN D 25 Hydroxy (Vit-D Deficiency, Fractures)   3. Essential hypertension, poorly controlled Anita Webb was a patient of Dr. Earlene Plater  as her PCP and when she left several months ago, patient did not follow up.  She used to be on losartan in the past as ACE caused a dry cough.  Anita Webb is completely asymptomatic and she is without concerns.  Anita Webb is not currently on medication.   BP Readings from Last 3 Encounters:  08/29/20 (!) 170/84  10/31/18 130/86  08/01/18 (!) 156/88   Plan:  Unstable.  Mylie's blood pressure is not at goal.  Follow up with PCP next available appointment.   Practice home blood pressure monitoring daily and bring log into next OV.   We will closely follow up.   Check Labs today.   Kyiesha is working on healthy weight loss and exercise to improve blood pressure control.  We will watch for signs of hypotension as she continues her lifestyle modifications.   Orders - Vitamin B12 - CBC with Differential/Platelet - Comprehensive metabolic panel - Folate - Hemoglobin A1c - Insulin, random - Lipid panel - T3 - T4, free - TSH - VITAMIN D 25 Hydroxy (Vit-D Deficiency, Fractures)   4. Nonalcoholic hepatosteatosis Anita Webb has been told that her liver enzymes were elevated in the past. Imaging showed fatty liver infiltrate.  Anita Webb has a new dx of elevated ALT. Her BMI is over 40. She  denies abdominal pain or jaundice and has never been told of any liver problems in the past. She denies excessive alcohol intake.  Lab Results  Component Value Date   ALT 30 08/29/2020   AST 35 08/29/2020   ALKPHOS 64 08/29/2020   BILITOT 0.3 08/29/2020   Plan: Check labs today. We discussed the likely diagnosis of non-alcoholic fatty liver disease today and how this condition is obesity related. Anita CoasterGina was educated the importance of weight loss. Anita CoasterGina agreed to continue with her weight loss efforts with healthier diet and exercise as an essential part of her treatment plan.   5. PCOS (polycystic ovarian syndrome) Anita Webb reports trulicity caused a skin rash. She also used to be on Metformin and Rybelsus. She was lost to follow  up.  Plan: Check labs today. Follow up with GYN and PCP in the near future as scheduled. Consider Metformin and Rybelsus. We discussed her high risk for impaired metabolic function.   6. B12 deficiency Anita CoasterGina is not a vegetarian.  She does not have a history of weight loss surgery. She takes an OTC B-complex daily.    CBC Latest Ref Rng & Units 08/29/2020 05/22/2019 06/24/2018  WBC 3.4 - 10.8 x10E3/uL 6.9 7.1 5.9  Hemoglobin 11.1 - 15.9 g/dL 45.414.6 09.814.4 11.914.7  Hematocrit 34.0 - 46.6 % 42.6 42.2 43.4  Platelets 150 - 450 x10E3/uL 195 229.0 230.0   No results found for: IRON, TIBC, FERRITIN Lab Results  Component Value Date   VITAMINB12 377 08/29/2020   Plan: Check labs today. The diagnosis was reviewed with the patient. Counseling provided today, see below. We will continue to monitor. Orders and follow up as documented in patient record.  Counseling . The body needs vitamin B12: to make red blood cells; to make DNA; and to help the nerves work properly so they can carry messages from the brain to the body.  . The main causes of vitamin B12 deficiency include dietary deficiency, digestive diseases, pernicious anemia, and having a surgery in which part of the stomach or small intestine is removed.  . Certain medicines can make it harder for the body to absorb vitamin B12. These medicines include: heartburn medications; some antibiotics; some medications used to treat diabetes, gout, and high cholesterol.  . In some cases, there are no symptoms of this condition. If the condition leads to anemia or nerve damage, various symptoms can occur, such as weakness or fatigue, shortness of breath, and numbness or tingling in your hands and feet.   . Treatment:  o May include taking vitamin B12 supplements.  o Avoid alcohol.  o Eat lots of healthy foods that contain vitamin B12: - Beef, pork, chicken, [place], and organ meats, such as liver.  - Seafood: This includes clams, rainbow trout, salmon, tuna, and  haddock. Eggs.  - Cereal and dairy products that are fortified: This means that vitamin B12 has been added to the food.   Orders - Vitamin B12 - CBC with Differential/Platelet - Comprehensive metabolic panel - Folate - Hemoglobin A1c - Insulin, random - Lipid panel - T3 - T4, free - TSH - VITAMIN D 25 Hydroxy (Vit-D Deficiency, Fractures)   7. Vitamin D deficiency Anita Webb's Vitamin D level was 31.65 on 05/22/2019. She is currently taking OTC vitamin D each day. She denies nausea, vomiting or muscle weakness.  Ref. Range 05/22/2019 08:34  VITD Latest Ref Range: 30.00 - 100.00 ng/mL 31.65    Plan: Check labs today. Low Vitamin D level contributes to fatigue  and are associated with obesity, breast, and colon cancer. She agrees to continue to take prescription Vitamin D @50 ,000 IU every week and will follow-up for routine testing of Vitamin D, at least 2-3 times per year to avoid over-replacement.  Orders - Vitamin B12 - CBC with Differential/Platelet - Comprehensive metabolic panel - Folate - Hemoglobin A1c - Insulin, random - Lipid panel - T3 - T4, free - TSH - VITAMIN D 25 Hydroxy (Vit-D Deficiency, Fractures)   8. Other depression, with emotional eating Anita Webb is not taking any medication. She has a PHQ score of 20. Anita Webb is struggling with emotional eating and using food for comfort to the extent that it is negatively impacting her health. She has been working on behavior modification techniques to help reduce her emotional eating. She shows no sign of suicidal or homicidal ideations.  Plan: Check labs today. Follow up with PCP regarding treatment of depression as well. We will consider Wellbutrin in the future. Behavior modification techniques were discussed today to help Anita Webb deal with her emotional/non-hunger eating behaviors.  Orders and follow up as documented in patient record.     9. At risk for impaired metabolic function Anita Webb was given approximately 28 minutes of  impaired  metabolic function prevention counseling today. We discussed intensive lifestyle modifications today with an emphasis on specific nutrition and exercise instructions and strategies.    10. Class 3 severe obesity with serious comorbidity and body mass index (BMI) of 50.0 to 59.9 in adult, unspecified obesity type (HCC) Anita Webb is currently in the action stage of change and her goal is to continue with weight loss efforts. I recommend Anita Webb begin the structured treatment plan as follows:  She has agreed to the Category 4 Plan.  Exercise goals: As is   Behavioral modification strategies: increasing lean protein intake, increasing water intake, no skipping meals, meal planning and cooking strategies, keeping healthy foods in the home and planning for success.   She was informed of the importance of frequent follow-up visits to maximize her success with intensive lifestyle modifications for her multiple health conditions. She was informed we would discuss her lab results at her next visit unless there is a critical issue that needs to be addressed sooner. Anita Webb agreed to keep her next visit at the agreed upon time to discuss these results.   Objective:   Blood pressure (!) 170/84, pulse 98, temperature 97.9 F (36.6 C), height 5\' 4"  (1.626 m), weight (!) 308 lb (139.7 kg), SpO2 98 %. Body mass index is 52.87 kg/m.  EKG: Normal sinus rhythm, rate 97.  Indirect Calorimeter completed today shows a VO2 of 527 and a REE of 3669.  Her calculated basal metabolic rate is Anita Webb thus her basal metabolic rate is better than expected.  General: Cooperative, alert, well developed, in no acute distress. HEENT: Conjunctivae and lids unremarkable. Cardiovascular: Regular rhythm.  Lungs: Normal work of breathing. Neurologic: No focal deficits.   Lab Results  Component Value Date   CREATININE 0.70 08/29/2020   BUN 9 08/29/2020   NA 137 08/29/2020   K 4.0 08/29/2020   CL 98 08/29/2020   CO2 21  08/29/2020   Lab Results  Component Value Date   ALT 30 08/29/2020   AST 35 08/29/2020   ALKPHOS 64 08/29/2020   BILITOT 0.3 08/29/2020   Lab Results  Component Value Date   HGBA1C 6.8 (H) 08/29/2020   HGBA1C 5.7 05/22/2019   HGBA1C 5.7 08/29/2018   HGBA1C 6.1 06/24/2018  Lab Results  Component Value Date   INSULIN 32.2 (H) 08/29/2020   Lab Results  Component Value Date   TSH 3.150 08/29/2020   Lab Results  Component Value Date   CHOL 202 (H) 08/29/2020   HDL 36 (L) 08/29/2020   LDLCALC 121 (H) 08/29/2020   LDLDIRECT 100.0 05/22/2019   TRIG 258 (H) 08/29/2020   CHOLHDL 5.6 (H) 08/29/2020   Lab Results  Component Value Date   WBC 6.9 08/29/2020   HGB 14.6 08/29/2020   HCT 42.6 08/29/2020   MCV 84 08/29/2020   PLT 195 08/29/2020    Attestation Statements:   Reviewed by clinician on day of visit: allergies, medications, problem list, medical history, surgical history, family history, social history, and previous encounter notes.  Edmund Hilda, am acting as Energy manager for [employer], DO.  I have reviewed the above documentation for accuracy and completeness, and I agree with the above. Carlye Grippe, D.O.  The 21st Century Cures Act was signed into law in 2016 which includes the topic of electronic health records.  This provides immediate access to information in MyChart.  This includes consultation notes, operative notes, office notes, lab results and pathology reports.  If you have any questions about what you read please let us know at your next visit so we can discuss your concerns and take corrective action if need be.  We are right here with you.

## 2020-09-03 ENCOUNTER — Telehealth (INDEPENDENT_AMBULATORY_CARE_PROVIDER_SITE_OTHER): Payer: Self-pay

## 2020-09-03 NOTE — Telephone Encounter (Signed)
Message from Dr Sharee Holster:    Anita Lot, DO  [name], CMA Please call pt and let her know that her labs show that she is a newly diagnosed Diabetic now and there are several other lab ABN, (that although not life threatening/critical), she will need to discuss these further with her PCP in the very near future as well.  Please reassure pt we will discuss them also at our f/up in 10 d or so, but explain to her that these chronic conditions need to be addressed by her PCP and further txmnt plan devised by her PCP.    Call to patient to make her aware of above.  Pt states that she currently does not have a pcp, but will be looking for one very soon.  Explained to patient that it is very important that a pcp manages her new diagnosis and has a treatment plan for her.  Pt verbalized understanding, no further questions, call ended.

## 2020-09-11 ENCOUNTER — Encounter (INDEPENDENT_AMBULATORY_CARE_PROVIDER_SITE_OTHER): Payer: Self-pay

## 2020-09-12 ENCOUNTER — Encounter (INDEPENDENT_AMBULATORY_CARE_PROVIDER_SITE_OTHER): Payer: Self-pay | Admitting: Family Medicine

## 2020-09-12 ENCOUNTER — Other Ambulatory Visit: Payer: Self-pay

## 2020-09-12 ENCOUNTER — Ambulatory Visit (INDEPENDENT_AMBULATORY_CARE_PROVIDER_SITE_OTHER): Payer: 59 | Admitting: Family Medicine

## 2020-09-12 VITALS — BP 143/86 | HR 76 | Temp 97.8°F | Ht 64.0 in | Wt 295.0 lb

## 2020-09-12 DIAGNOSIS — E1159 Type 2 diabetes mellitus with other circulatory complications: Secondary | ICD-10-CM

## 2020-09-12 DIAGNOSIS — E559 Vitamin D deficiency, unspecified: Secondary | ICD-10-CM | POA: Diagnosis not present

## 2020-09-12 DIAGNOSIS — Z9189 Other specified personal risk factors, not elsewhere classified: Secondary | ICD-10-CM | POA: Diagnosis not present

## 2020-09-12 DIAGNOSIS — Z6841 Body Mass Index (BMI) 40.0 and over, adult: Secondary | ICD-10-CM

## 2020-09-12 DIAGNOSIS — E1169 Type 2 diabetes mellitus with other specified complication: Secondary | ICD-10-CM

## 2020-09-12 DIAGNOSIS — E119 Type 2 diabetes mellitus without complications: Secondary | ICD-10-CM | POA: Insufficient documentation

## 2020-09-12 DIAGNOSIS — E538 Deficiency of other specified B group vitamins: Secondary | ICD-10-CM | POA: Diagnosis not present

## 2020-09-12 DIAGNOSIS — I152 Hypertension secondary to endocrine disorders: Secondary | ICD-10-CM

## 2020-09-12 DIAGNOSIS — E785 Hyperlipidemia, unspecified: Secondary | ICD-10-CM

## 2020-09-12 MED ORDER — VITAMIN D (ERGOCALCIFEROL) 1.25 MG (50000 UNIT) PO CAPS: 50000.0000 [IU] | ORAL_CAPSULE | ORAL | 0 refills | Status: DC

## 2020-09-12 MED ORDER — LOSARTAN POTASSIUM-HCTZ 50-12.5 MG PO TABS: ORAL_TABLET | ORAL | 0 refills | Status: DC

## 2020-09-16 NOTE — Progress Notes (Signed)
Chief Complaint:   OBESITY Anita Webb is here to discuss her progress with her obesity treatment plan along with follow-up of her obesity related diagnoses. Anita Webb is on the Category 4 Plan and states she is following her eating plan approximately 99% of the time. Anita Webb states she is exercising 0 minutes 0 times per week.  Today's visit was #: 2 Starting weight: 308 lbs Starting date: 08/29/2020 Today's weight: 295 lbs Today's date: 09/12/2020 Total lbs lost to date: 13 lbs Total lbs lost since last in-office visit: 13 lbs Total weight loss percentage to date: -4.22%  Interim History: Anita Webb is here today to review her NEW Meal Plan and to discuss all recent labs done here and/ or done at outside facilities.  Extended time was spent counseling Anita BrookesGina L Webb on all new disease processes that were discovered or that are worsening. Pt says it is a lot food and a lot of proteins, so she is moving proteins to earlier in the day. Pt denies hunger and cravings. She is getting snack calories from Yaso bars, nuts, 100 calorie popcorn bags.  Plan: Explained pt should follow up with PCP since newly diagnosed diabetic to discuss glucometer use, obtain additional screening (urine micro, etc.) and to discuss cholesterol panel and statin use, etc.    Assessment/Plan:   1. Hyperlipidemia associated with type 2 diabetes mellitus (HCC) Worsening. Discussed labs with patient today. Pt is seeing a new PCP in early Feb 2022. She has no history of cholesterol meds but this is the first time she is being diagnosed with diabetes. LDL is elevated and HDL is too low.    Lab Results  Component Value Date   ALT 30 08/29/2020   AST 35 08/29/2020   ALKPHOS 64 08/29/2020   BILITOT 0.3 08/29/2020   Lab Results  Component Value Date   CHOL 202 (H) 08/29/2020   HDL 36 (L) 08/29/2020   LDLCALC 121 (H) 08/29/2020   LDLDIRECT 100.0 05/22/2019   TRIG 258 (H) 08/29/2020   CHOLHDL 5.6 (H) 08/29/2020   Plan:  Recheck labs in 3 months, but highly encouraged pt to speak with PCP regarding starting statin therapy. Advised prudent nutritional plan with low saturated and trans fats and eventually exercise.   2. Type 2 diabetes mellitus with other specified complication, without long-term current use of insulin (HCC) New. Discussed labs with patient today. With Dr. Earlene PlaterWallace as PCP in the past, pt was on Rybelsus, Trulicity, and Metformin for PCOS and IR. She last took them 6-9 months ago.   Lab Results  Component Value Date   HGBA1C 6.8 (H) 08/29/2020   HGBA1C 5.7 05/22/2019   HGBA1C 5.7 08/29/2018   Lab Results  Component Value Date   LDLCALC 121 (H) 08/29/2020   CREATININE 0.70 08/29/2020   Lab Results  Component Value Date   INSULIN 32.2 (H) 08/29/2020   Plan: Discussed with pt and she wishes to not go back on meds at this time. She wishes to work on 3 months of weight loss/diet/exerise and recheck in 3 months. Pt understands the usefulness of these meds with weight loss but still wishes to wait on meds.   3. Hypertension associated with type 2 diabetes mellitus (HCC) Worsening. Discussed labs with patient today. Pt reports BP at home as follows: BP 160/102 P 90, 185/111, 154/104, 168/111, 164/114 P 68, 147/94, 173/112 P 80. Pt has history of ACE therapy but denies use due to causing cough.   BP Readings  from Last 3 Encounters:  09/12/20 (!) 143/86  08/29/20 (!) 170/84  10/31/18 130/86   Plan: Start losartan-HCTZ 1/2 tablet of 50/12.5 mg. If after 1 week BP is still not at goal, increase to 1 tablet by mouth daily. Decrease salt. prudent nutritional plan, and weight loss.  4. Vitamin D deficiency Worsening. Discussed labs with patient today. Anita Webb's Vitamin D level was 27.0 on 08/29/2020. She is currently taking OTC vitamin D gummies each day. She denies nausea, vomiting or muscle weakness.  Ref. Range 08/29/2020 09:46  Vitamin D, 25-Hydroxy Latest Ref Range: 30.0 - 100.0 ng/mL 27.0 (L)    Plan: Start prescription Vit D supplement.  - Discussed importance of vitamin D to their health and well-being.  - possible symptoms of low Vitamin D can be low energy, depressed mood, muscle aches, joint aches, osteoporosis etc. - low Vitamin D levels may be linked to an increased risk of cardiovascular events and even increased risk of cancers- such as colon and breast.  - I recommend pt take a weekly prescription vit D - see script below   - Informed patient this may be a lifelong thing, and she was encouraged to continue to take the medicine until told otherwise.   - we will need to monitor levels regularly (every 3-4 mo on average) to keep levels within normal limits.  - weight loss will likely improve availability of vitamin D, thus encouraged Kielyn to continue with meal plan and their weight loss efforts to further improve this condition - pt's questions and concerns regarding this condition addressed.   5. B12 deficiency Discussed labs with patient today. Pt is taking 1 OTC B-complex daily. She notes fatigue and shortness of breath. She is not a vegetarian.  She does not have a previous diagnosis of pernicious anemia.  She does not have a history of weight loss surgery.   Lab Results  Component Value Date   VITAMINB12 377 08/29/2020   Plan: Continue OTC B12 supplement. Counseling done on B12 deficiency and the importance of adequate B12 levels. The diagnosis was reviewed with the patient. Counseling provided today, see below. We will continue to monitor. Orders and follow up as documented in patient record.  Counseling . The body needs vitamin B12: to make red blood cells; to make DNA; and to help the nerves work properly so they can carry messages from the brain to the body.  . The main causes of vitamin B12 deficiency include dietary deficiency, digestive diseases, pernicious anemia, and having a surgery in which part of the stomach or small intestine is removed.  . Certain  medicines can make it harder for the body to absorb vitamin B12. These medicines include: heartburn medications; some antibiotics; some medications used to treat diabetes, gout, and high cholesterol.  . In some cases, there are no symptoms of this condition. If the condition leads to anemia or nerve damage, various symptoms can occur, such as weakness or fatigue, shortness of breath, and numbness or tingling in your hands and feet.   . Treatment:  o May include taking vitamin B12 supplements.  o Avoid alcohol.  o Eat lots of healthy foods that contain vitamin B12: - Beef, pork, chicken, [place], and organ meats, such as liver.  - Seafood: This includes clams, rainbow trout, salmon, tuna, and haddock. Eggs.  - Cereal and dairy products that are fortified: This means that vitamin B12 has been added to the food.   6. At risk for heart disease Due to Prajna's  current state of health and medical condition(s), she is at a higher risk for heart disease. This puts the patient at much greater risk to subsequently develop cardiopulmonary conditions that can significantly affect patient's quality of life in a negative manner as well.    At least 27 minutes was spent on counseling Zoeie about these concerns today and I stressed the importance of reversing risks factors of obesity, esp truncal and visceral fat, hypertension, hyperlipidemia, pre-diabetes. Initial goal is to lose at least 5-10% of starting weight to help reduce these risk factors. Counseling: Intensive lifestyle modifications discussed with Beya as most appropriate first line treatment. She will continue to work on diet, exercise and weight loss efforts. We will continue to reassess these conditions on a fairly regular basis in an attempt to decrease patient's overall morbidity and mortality.  Evidence-based interventions for health behavior change were utilized today including the discussion of self monitoring techniques, problem-solving barriers and  SMART goal setting techniques. Specifically regarding patient's less desirable eating habits and patterns, we employed the technique of small changes when Jewels has not been able to fully commit to her prudent nutritional plan.   7. Class 3 severe obesity with serious comorbidity and body mass index (BMI) of 50.0 to 59.9 in adult, unspecified obesity type (HCC) Kynisha is currently in the action stage of change. As such, her goal is to continue with weight loss efforts. She has agreed to the Category 4 Plan.   Exercise goals: As is  Behavioral modification strategies: increasing lean protein intake, decreasing simple carbohydrates, meal planning and cooking strategies, keeping healthy foods in the home and planning for success.  Yasira has agreed to follow-up with our clinic in 2 weeks. She was informed of the importance of frequent follow-up visits to maximize her success with intensive lifestyle modifications for her multiple health conditions.   Objective:   Blood pressure (!) 143/86, pulse 76, temperature 97.8 F (36.6 C), height 5\' 4"  (1.626 m), weight 295 lb (133.8 kg), SpO2 98 %. Body mass index is 50.64 kg/m.  General: Cooperative, alert, well developed, in no acute distress. HEENT: Conjunctivae and lids unremarkable. Cardiovascular: Regular rhythm.  Lungs: Normal work of breathing. Neurologic: No focal deficits.   Lab Results  Component Value Date   CREATININE 0.70 08/29/2020   BUN 9 08/29/2020   NA 137 08/29/2020   K 4.0 08/29/2020   CL 98 08/29/2020   CO2 21 08/29/2020   Lab Results  Component Value Date   ALT 30 08/29/2020   AST 35 08/29/2020   ALKPHOS 64 08/29/2020   BILITOT 0.3 08/29/2020   Lab Results  Component Value Date   HGBA1C 6.8 (H) 08/29/2020   HGBA1C 5.7 05/22/2019   HGBA1C 5.7 08/29/2018   HGBA1C 6.1 06/24/2018   Lab Results  Component Value Date   INSULIN 32.2 (H) 08/29/2020   Lab Results  Component Value Date   TSH 3.150 08/29/2020   Lab  Results  Component Value Date   CHOL 202 (H) 08/29/2020   HDL 36 (L) 08/29/2020   LDLCALC 121 (H) 08/29/2020   LDLDIRECT 100.0 05/22/2019   TRIG 258 (H) 08/29/2020   CHOLHDL 5.6 (H) 08/29/2020   Lab Results  Component Value Date   WBC 6.9 08/29/2020   HGB 14.6 08/29/2020   HCT 42.6 08/29/2020   MCV 84 08/29/2020   PLT 195 08/29/2020   No results found for: IRON, TIBC, FERRITIN  Attestation Statements:   Reviewed by clinician on day of visit:  allergies, medications, problem list, medical history, surgical history, family history, social history, and previous encounter notes.  Edmund Hilda, am acting as Energy manager for [employer], DO.  I have reviewed the above documentation for accuracy and completeness, and I agree with the above. Carlye Grippe, D.O.  The 21st Century Cures Act was signed into law in 2016 which includes the topic of electronic health records.  This provides immediate access to information in MyChart.  This includes consultation notes, operative notes, office notes, lab results and pathology reports.  If you have any questions about what you read please let us know at your next visit so we can discuss your concerns and take corrective action if need be.  We are right here with you.

## 2020-09-30 ENCOUNTER — Other Ambulatory Visit: Payer: Self-pay

## 2020-09-30 ENCOUNTER — Ambulatory Visit (INDEPENDENT_AMBULATORY_CARE_PROVIDER_SITE_OTHER): Payer: 59 | Admitting: Family

## 2020-09-30 VITALS — BP 165/95 | HR 75 | Ht 64.88 in | Wt 291.4 lb

## 2020-09-30 DIAGNOSIS — Z23 Encounter for immunization: Secondary | ICD-10-CM

## 2020-09-30 DIAGNOSIS — Z7689 Persons encountering health services in other specified circumstances: Secondary | ICD-10-CM | POA: Diagnosis not present

## 2020-09-30 DIAGNOSIS — E1169 Type 2 diabetes mellitus with other specified complication: Secondary | ICD-10-CM | POA: Diagnosis not present

## 2020-09-30 DIAGNOSIS — Z6841 Body Mass Index (BMI) 40.0 and over, adult: Secondary | ICD-10-CM

## 2020-09-30 DIAGNOSIS — E119 Type 2 diabetes mellitus without complications: Secondary | ICD-10-CM | POA: Diagnosis not present

## 2020-09-30 DIAGNOSIS — Z30430 Encounter for insertion of intrauterine contraceptive device: Secondary | ICD-10-CM | POA: Insufficient documentation

## 2020-09-30 DIAGNOSIS — E785 Hyperlipidemia, unspecified: Secondary | ICD-10-CM

## 2020-09-30 NOTE — Progress Notes (Signed)
Establish care Going to [facility] which adv her she needed PCP Wants flu shot

## 2020-09-30 NOTE — Patient Instructions (Addendum)
Return for annual physical examination, labs, and health maintenance. Arrive fasting meaning having had no food and/or nothing to drink for at least 8 hours prior to appointment.  Patient would like to continue with diet and exercise for diabetes management at the moment.   Flu vaccine today.  Thank you for choosing Primary Care at [facility] for your medical home!    Anita Webb was seen by Rema Fendt, NP today.   Anita Webb's primary care provider is Ramzi Brathwaite Jodi Geralds, NP.   For the best care possible,  you should try to see Ricky Stabs, NP whenever you come to clinic.   We look forward to seeing you again soon!  If you have any questions about your visit today,  please call us at [phone]  Or feel free to reach your provider via MyChart.    Type 2 Diabetes Mellitus, Diagnosis, Adult Type 2 diabetes (type 2 diabetes mellitus) is a long-term disease. It may happen when there is one or both of these problems:  The pancreas does not make enough insulin.  The body does not react in a normal way to insulin that it makes. Insulin lets sugars go into cells in your body. If you have type 2 diabetes, sugars cannot get into your cells. Sugars build up in the blood. This causes high blood sugar. What are the causes? The exact cause of this condition is not known. What increases the risk? The following factors may make you more likely to develop this condition:  Having type 2 diabetes in your family.  Being overweight or very overweight.  Not being active.  Your body not reacting in a normal way to the insulin it makes.  Having higher than normal blood sugar over time.  Having a type of diabetes when you were pregnant.  Having a condition that causes small fluid-filled sacs on your ovaries. What are the signs or symptoms? At first, you may have no symptoms. You will get symptoms slowly. They may include:  More thirst than normal.  More hunger than  normal.  Needing to pee more than normal.  Losing weight without trying.  Feeling tired.  Feeling weak.  Seeing things blurry.  Dark patches on your skin. How is this treated? This condition may be treated by a diabetes expert. You may need to:  Follow an eating plan made by a food expert (dietitian).  Get regular exercise.  Find ways to deal with stress.  Check blood sugar as often as told.  Take medicines. Your doctor will set treatment goals for you. Your blood sugar should be at these levels:  Before meals: 80-130 mg/dL (6.0-7.3 mmol/L).  After meals: below 180 mg/dL (10 mmol/L).  Over the last 2-3 months: less than 7%. Follow these instructions at home: Medicines  Take your diabetes medicines or insulin every day.  Take medicines to help you not get other problems caused by this condition. You may need: ? Aspirin. ? Medicine to lower cholesterol. ? Medicine to control blood pressure. Questions to ask your doctor  Should I meet with a diabetes educator?  What medicines do I need, and when should I take them?  What will I need to treat my condition at home?  When should I check my blood sugar?  Where can I find a support group?  Who can I call if I have questions?  When is my next doctor visit? General instructions  Take over-the-counter and prescription medicines only as  told by your doctor.  Keep all follow-up visits as told by your doctor. This is important. Where to find more information  American Diabetes Association (ADA): www.diabetes.org  American Association of Diabetes Care and Education Specialists (ADCES): www.diabeteseducator.org  International Diabetes Federation (IDF): DCOnly.dk Contact a doctor if:  Your blood sugar is at or above 240 mg/dL (30.1 mmol/L) for 2 days in a row.  You have been sick for 2 days or more, and you are not getting better.  You have had a fever for 2 days or more, and you are not getting  better.  You have any of these problems for more than 6 hours: ? You cannot eat or drink. ? You feel like you may vomit. ? You vomit. ? You have watery poop (diarrhea). Get help right away if:  Your blood sugar is very low. This means it is lower than 54 mg/dL (3 mmol/L).  You feel mixed up (confused).  You have trouble thinking clearly.  You have trouble breathing.  You have medium or large ketone levels in your pee. These symptoms may be an emergency. Do not wait to see if the symptoms will go away. Get medical help right away. Call your local emergency services (911 in the [place] Do not drive yourself to the hospital. Summary  Type 2 diabetes is a long-term disease. Your pancreas may not make enough insulin, or your body may not react in a normal way to insulin that it makes.  This condition is treated with an eating plan, lifestyle changes, and medicines.  Your doctor will set treatment goals for you. These will help you keep your blood sugar in a healthy range.  Keep all follow-up visits as told by your doctor. This is important. This information is not intended to replace advice given to you by your health care provider. Make sure you discuss any questions you have with your health care provider. Document Revised: 03/06/2020 Document Reviewed: 03/06/2020 Elsevier Patient Education  2021 [employer].

## 2020-09-30 NOTE — Progress Notes (Signed)
Subjective:    Anita Webb - 40 y.o. female MRN [id number]  Date of birth: 08-16-1981  HPI  Anita Webb is to establish care. Patient has a PMH significant for essential hypertension, allergic rhinitis, hepatic steatosis, nonalcoholic hepatosteatosis, insulin resistance, polycystic ovarian syndrome, hyperlipidemia associated with type 2 diabetes mellitus, hirsutism, primary insomnia, amenorrhea, pure hypercholesterolemia, vitamin D deficiency, elevated LFTs, shortness of breath on exertion, B12 deficiency, depression, and morbid obesity.   Current issues and/or concerns: 1. DIABETES TYPE 2: 09/12/2020 visit at [facility] per DO note: New. Discussed labs with patient today. With Dr. Juleen China as PCP in the past, pt was on Rybelsus, Trulicity, and Metformin for PCOS and IR. She last took them 6-9 months ago.  Plan: Discussed with pt and she wishes to not go back on meds at this time. She wishes to work on 3 months of weight loss/diet/exerise and recheck in 3 months. Pt understands the usefulness of these meds with weight loss but still wishes to wait on meds.   09/30/2020: Today patient reports she would like to continue diet and exercise regimen at this time for management of diabetes. Declined diabetic medication. Next weigh-in April 2022. Last A1C: 6.8% on 08/29/2020  Diet Adherence: _0  Yes    _1  No Exercise: _2  Yes    _3  No Last eye exam: 1 year ago Comments:   2. HYPERLIPIDEMIA: Today reports would like to improve high cholesterol with diet and exercise.  Last Lipid Panel results:  HDL  Date Value Ref Range Status  08/29/2020 36 (L) >39 mg/dL Final   Triglycerides  Date Value Ref Range Status  08/29/2020 258 (H) 0 - 149 mg/dL Final     ROS per HPI     Health Maintenance:  Health Maintenance Due  Topic Date Due  . Hepatitis C Screening  Never done  . PNEUMOCOCCAL POLYSACCHARIDE VACCINE AGE 81-64 HIGH RISK  Never done  . COVID-19 Vaccine (1) Never  done  . FOOT EXAM  Never done  . OPHTHALMOLOGY EXAM  Never done  . INFLUENZA VACCINE  03/24/2020    Past Medical History: Patient Active Problem List   Diagnosis Date Noted  . Encounter for IUD insertion 09/30/2020  . Hyperlipidemia associated with type 2 diabetes mellitus ([place]) 09/12/2020  . Diabetes mellitus ([place]) 09/12/2020  . Hypertension associated with type 2 diabetes mellitus ([place]) 09/12/2020  . At risk for heart disease 09/12/2020  . Other fatigue 08/29/2020  . SOBOE (shortness of breath on exertion) 08/29/2020  . Nonalcoholic hepatosteatosis 85/88/5027  . PCOS (polycystic ovarian syndrome) 08/29/2020  . B12 deficiency 08/29/2020  . At risk for impaired metabolic function 74/07/8785  . Depression 08/29/2020  . 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 Elevated LFTs 10/31/2018  . Hepatic steatosis 10/31/2018  . Primary insomnia 06/25/2018  . Hirsutism 06/25/2018  . Amenorrhea 06/25/2018  . Essential hypertension 06/25/2018  . Insulin resistance 06/25/2018  . Pure hypercholesterolemia 06/25/2018  . Vitamin D deficiency 06/25/2018  . Allergic rhinitis 02/22/2015  . Morbid obesity ([place]) 02/22/2015    Social History   reports that she has never smoked. She has never used smokeless tobacco. She reports current alcohol use. She reports that she does not use drugs.   Family History  family history includes Breast cancer (age of onset: 16) in her maternal grandmother; Cancer in her maternal grandmother; Dementia in her mother; Depression in her mother; Diabetes in her maternal grandmother; Heart disease in her maternal grandfather and paternal grandmother; Hyperlipidemia in  her father, maternal grandfather, and mother; Hypertension in her father, maternal grandfather, mother, and paternal grandmother; Obesity in her mother; Thyroid disease in her mother.   Medications: reviewed and updated   Objective:   Physical Exam BP (!) 165/95 (BP Location: Left Arm, Patient Position: Sitting)    Pulse 75   Ht 5' 4.88" (1.648 m)   Wt 291 lb 6.4 oz (132.2 kg)   SpO2 100%   BMI 48.67 kg/m    Wt Readings from Last 3 Encounters:  09/30/20 291 lb 6.4 oz (132.2 kg)  09/12/20 295 lb (133.8 kg)  08/29/20 (!) 308 lb (139.7 kg)    Physical Exam Constitutional:      Appearance: She is obese.  HENT:     Head: Normocephalic.  Eyes:     Extraocular Movements: Extraocular movements intact.     Pupils: Pupils are equal, round, and reactive to light.  Cardiovascular:     Rate and Rhythm: Normal rate and regular rhythm.     Pulses: Normal pulses.     Heart sounds: Normal heart sounds.  Pulmonary:     Effort: Pulmonary effort is normal.     Breath sounds: Normal breath sounds.  Musculoskeletal:     Cervical back: Normal range of motion and neck supple.  Neurological:     General: No focal deficit present.     Mental Status: She is alert and oriented to person, place, and time.  Psychiatric:        Mood and Affect: Mood normal.        Behavior: Behavior normal.       Assessment & Plan:  1. Encounter to establish care: - Patient presents today to establish care.  - Return for annual physical examination, labs, and health maintenance. Arrive fasting meaning having had no food and/or nothing to drink for at least 8 hours prior to appointment.  2. Type 2 diabetes mellitus without complication, without long-term current use of insulin ([place]): - Hemoglobin A1c elevated at 6.8% on 08/29/2020 at [facility]. Previous hemoglobin A1c 5.7% on 05/22/2019. - Today patient reports she would like to continue diet and exercise regimen at this time for management of diabetes. Declined anti-diabetic medication. - Counseled on the importance of anti-diabetic medication such as but not limited to possible prevention of heart attack and or stroke. Patient verbalized understanding.  - Discussed the importance of healthy eating habits, low-carbohydrate diet, low-sugar diet, and regular  aerobic exercise (at least 150 minutes a week as tolerated) to achieve or maintain control of diabetes. - To achieve an A1C goal of less than or equal to 7.0 percent, a fasting blood sugar of 80 to 130 mg/dL and a postprandial glucose (90 to 120 minutes after a meal) less than 180 mg/dL. In the event of sugars less than 60 mg/dl or greater than 400 mg/dl please notify the clinic ASAP. It is recommended that you undergo annual eye exams and annual foot exams. Please write blood sugars daily with date, bring these to next visit with primary provider. - Follow-up in 2 months or sooner if needed with primary provider for repeat hemoglobin A1c and management of chronic condition.  - Blood Glucose Monitoring Suppl (TRUE METRIX METER) w/Device KIT; Use as directed  Dispense: 1 kit; Refill: 0 - glucose blood (TRUE METRIX BLOOD GLUCOSE TEST) test strip; Use as instructed  Dispense: 100 each; Refill: 12 - TRUEplus Lancets 28G MISC; Use as directed  Dispense: 100 each; Refill: 4  3. Hyperlipidemia  associated with type 2 diabetes mellitus ([place]): - Today reports would like to improve high cholesterol with diet and exercise. - Practice low-fat heart healthy diet and at least 150 minutes of moderate intensity exercise weekly as tolerated.  - Follow-up with primary provider in 3 months to recheck cholesterol.    4. Class 3 severe obesity with serious comorbidity and body mass index (BMI) of 45.0 to 49.9 in adult, unspecified obesity type ([place]): - Counseled on low-sodium, DASH diet and 150 minutes of moderate intensity exercise per week as tolerated. Discussed medication compliance, adverse effects. - Keep plan of care and appointments with [facility]. Next weigh-in April 2022.  Durene Fruits, NP 09/30/2020, 9:59 PM Primary Care at [facility]

## 2020-10-02 ENCOUNTER — Ambulatory Visit (INDEPENDENT_AMBULATORY_CARE_PROVIDER_SITE_OTHER): Payer: 59 | Admitting: Family Medicine

## 2020-10-02 ENCOUNTER — Encounter (INDEPENDENT_AMBULATORY_CARE_PROVIDER_SITE_OTHER): Payer: Self-pay | Admitting: Family Medicine

## 2020-10-02 ENCOUNTER — Other Ambulatory Visit: Payer: Self-pay

## 2020-10-02 VITALS — BP 156/86 | HR 70 | Temp 97.7°F | Ht 64.0 in | Wt 286.0 lb

## 2020-10-02 DIAGNOSIS — E1169 Type 2 diabetes mellitus with other specified complication: Secondary | ICD-10-CM | POA: Diagnosis not present

## 2020-10-02 DIAGNOSIS — E1159 Type 2 diabetes mellitus with other circulatory complications: Secondary | ICD-10-CM | POA: Diagnosis not present

## 2020-10-02 DIAGNOSIS — Z6841 Body Mass Index (BMI) 40.0 and over, adult: Secondary | ICD-10-CM

## 2020-10-02 DIAGNOSIS — E119 Type 2 diabetes mellitus without complications: Secondary | ICD-10-CM | POA: Diagnosis not present

## 2020-10-02 DIAGNOSIS — E559 Vitamin D deficiency, unspecified: Secondary | ICD-10-CM

## 2020-10-02 DIAGNOSIS — Z9189 Other specified personal risk factors, not elsewhere classified: Secondary | ICD-10-CM

## 2020-10-02 DIAGNOSIS — I152 Hypertension secondary to endocrine disorders: Secondary | ICD-10-CM

## 2020-10-02 DIAGNOSIS — E785 Hyperlipidemia, unspecified: Secondary | ICD-10-CM

## 2020-10-02 DIAGNOSIS — K760 Fatty (change of) liver, not elsewhere classified: Secondary | ICD-10-CM

## 2020-10-03 ENCOUNTER — Encounter (INDEPENDENT_AMBULATORY_CARE_PROVIDER_SITE_OTHER): Payer: Self-pay | Admitting: Family Medicine

## 2020-10-03 MED ORDER — TRUE METRIX BLOOD GLUCOSE TEST VI STRP: ORAL_STRIP | 12 refills | Status: AC

## 2020-10-03 MED ORDER — TRUEPLUS LANCETS 28G MISC: 4 refills | Status: AC

## 2020-10-03 MED ORDER — TRUE METRIX METER W/DEVICE KIT: PACK | 0 refills | Status: AC

## 2020-10-07 ENCOUNTER — Encounter: Payer: Self-pay | Admitting: Family

## 2020-10-07 NOTE — Telephone Encounter (Signed)
Att to contact pt to discuss Blood glucose monitoring and to reschedule appt in March

## 2020-10-07 NOTE — Progress Notes (Signed)
Chief Complaint:   OBESITY Anita Webb is here to discuss her progress with her obesity treatment plan along with follow-up of her obesity related diagnoses.   Today's visit was #: 3 Starting weight: 308 lbs Starting date: 08/29/2020 Today's weight: 286 lbs Today's date: 10/02/2020 Total lbs lost to date: 22 lbs Body mass index is 49.09 kg/m.  Total weight loss percentage to date: -7.14%  Interim History: Anita Webb says her home blood pressures range 130s/80s.  She is taking Delta 8 (1/2 gummy) prn.  She could not tolerate Trulicity - caused a rash. Nutrition Plan: Category 4 Plan for 99% of the time. Activity: None at this time.  Assessment/Plan:   1. Type 2 diabetes mellitus without complication, without long-term current use of insulin (HCC) Diabetes Mellitus: Not at goal. Medication: None. Issues reviewed: blood sugar goals, complications of diabetes mellitus, hypoglycemia prevention and treatment, exercise, and nutrition.   Plan: The patient was encouraged to monitor blood sugars regularly and to bring their log to the next appointment for review.  The importance of regular follow up with PCP and all other specialists as scheduled was stressed to patient today.  Lab Results  Component Value Date   HGBA1C 6.8 (H) 08/29/2020   HGBA1C 5.7 05/22/2019   HGBA1C 5.7 08/29/2018   Lab Results  Component Value Date   LDLCALC 121 (H) 08/29/2020   CREATININE 0.70 08/29/2020   2. Hyperlipidemia associated with type 2 diabetes mellitus (HCC) Course: Not at goal. Lipid-lowering medications: None.   Plan: Dietary changes: Increase soluble fiber, decrease simple carbohydrates, decrease saturated fat. Exercise changes: Moderate to vigorous-intensity aerobic activity 150 minutes per week or as tolerated. We will continue to monitor along with PCP/specialists as it pertains to her weight loss journey.  Lab Results  Component Value Date   CHOL 202 (H) 08/29/2020   HDL 36 (L) 08/29/2020    LDLCALC 121 (H) 08/29/2020   LDLDIRECT 100.0 05/22/2019   TRIG 258 (H) 08/29/2020   CHOLHDL 5.6 (H) 08/29/2020   Lab Results  Component Value Date   ALT 30 08/29/2020   AST 35 08/29/2020   ALKPHOS 64 08/29/2020   BILITOT 0.3 08/29/2020   The 10-year ASCVD risk score Denman George DC Jr., et al., 2013) is: 5.3%   Values used to calculate the score:     Age: 40 years     Sex: Female     Is Non-Hispanic African American: No     Diabetic: Yes     Tobacco smoker: No     Systolic Blood Pressure: 156 mmHg     Is BP treated: Yes     HDL Cholesterol: 36 mg/dL     Total Cholesterol: 202 mg/dL  3. Hypertension associated with type 2 diabetes mellitus (HCC) Not at goal. Medications: Hyzaar 50-12.5 1/2 tablet daily.   Plan: Avoid buying foods that are: processed, frozen, or prepackaged to avoid excess salt. Ambulatory blood pressure monitoring was encouraged with a goal of at least 2-3 times weekly or when feeling poorly.  She was instructed to keep a log for [place] to review at each office visit.   We will continue to monitor closely alongside her PCP and/or Specialist.  Regular follow up with PCP and specialists was also encouraged.   BP Readings from Last 3 Encounters:  10/02/20 (!) 156/86  09/30/20 (!) 165/95  09/12/20 (!) 143/86   Lab Results  Component Value Date   CREATININE 0.70 08/29/2020   4. Vitamin D deficiency Not at  goal. Current vitamin D is 08/29/2020, tested on 08/29/2020. Optimal goal > 50 ng/dL.   Plan: Continue to take prescription Vitamin D @50 ,000 IU every week as prescribed.  Follow-up for routine testing of Vitamin D, at least 2-3 times per year to avoid over-replacement.  5. Nonalcoholic hepatosteatosis NAFLD is an umbrella term that encompasses a disease spectrum that includes steatosis (fat) without inflammation, steatohepatitis (NASH; fat + inflammation in a characteristic pattern), and cirrhosis. Bland steatosis is felt to be a benign condition, with extremely low to no  risk of progression to cirrhosis, whereas NASH can progress to cirrhosis. The mainstay of treatment of NAFLD includes lifestyle modification to achieve weight loss, at least 7% of current body weight. Low carbohydrate diets can be beneficial in improving NAFLD liver histology. Additionally, exercise, even the absence of weight loss can have beneficial effects on the patient's metabolic profile and liver health.   6. At risk for heart disease Due to Anita Webb's current state of health and medical condition(s), she is at a higher risk for heart disease.  This puts the patient at much greater risk to subsequently develop cardiopulmonary conditions that can significantly affect patient's quality of life in a negative manner.    At least 10 minutes were spent on counseling Anita Webb about these concerns today. Counseling:  Intensive lifestyle modifications were discussed with Anita Webb as the most appropriate first line of treatment.  she will continue to work on diet, exercise, and weight loss efforts.  We will continue to reassess these conditions on a fairly regular basis in an attempt to decrease the patient's overall morbidity and mortality.  Evidence-based interventions for health behavior change were utilized today including the discussion of self monitoring techniques, problem-solving barriers, and SMART goal setting techniques.  Specifically, regarding patient's less desirable eating habits and patterns, we employed the technique of small changes when Anita Webb has not been able to fully commit to her prudent nutritional plan.  7. Class 3 severe obesity with serious comorbidity and body mass index (BMI) of 45.0 to 49.9 in adult, unspecified obesity type (HCC)  Course: Anita Webb is currently in the action stage of change. As such, her goal is to continue with weight loss efforts.   Nutrition goals: She has agreed to the Category 4 Plan.   Exercise goals: Walking/hiking for 30 minutes 2 days per week.  Behavioral  modification strategies: increasing lean protein intake, decreasing simple carbohydrates, increasing vegetables, increasing water intake and decreasing liquid calories.  Anita Webb has agreed to follow-up with our clinic in 3 weeks. She was informed of the importance of frequent follow-up visits to maximize her success with intensive lifestyle modifications for her multiple health conditions.   Objective:   Blood pressure (!) 156/86, pulse 70, temperature 97.7 F (36.5 C), temperature source Oral, height 5\' 4"  (1.626 m), weight 286 lb (129.7 kg), SpO2 96 %. Body mass index is 49.09 kg/m.  General: Cooperative, alert, well developed, in no acute distress. HEENT: Conjunctivae and lids unremarkable. Cardiovascular: Regular rhythm.  Lungs: Normal work of breathing. Neurologic: No focal deficits.   Lab Results  Component Value Date   CREATININE 0.70 08/29/2020   BUN 9 08/29/2020   NA 137 08/29/2020   K 4.0 08/29/2020   CL 98 08/29/2020   CO2 21 08/29/2020   Lab Results  Component Value Date   ALT 30 08/29/2020   AST 35 08/29/2020   ALKPHOS 64 08/29/2020   BILITOT 0.3 08/29/2020   Lab Results  Component Value Date  HGBA1C 6.8 (H) 08/29/2020   HGBA1C 5.7 05/22/2019   HGBA1C 5.7 08/29/2018   HGBA1C 6.1 06/24/2018   Lab Results  Component Value Date   INSULIN 32.2 (H) 08/29/2020   Lab Results  Component Value Date   TSH 3.150 08/29/2020   Lab Results  Component Value Date   CHOL 202 (H) 08/29/2020   HDL 36 (L) 08/29/2020   LDLCALC 121 (H) 08/29/2020   LDLDIRECT 100.0 05/22/2019   TRIG 258 (H) 08/29/2020   CHOLHDL 5.6 (H) 08/29/2020   Lab Results  Component Value Date   WBC 6.9 08/29/2020   HGB 14.6 08/29/2020   HCT 42.6 08/29/2020   MCV 84 08/29/2020   PLT 195 08/29/2020   Attestation Statements:   Reviewed by clinician on day of visit: allergies, medications, problem list, medical history, surgical history, family history, social history, and previous encounter  notes.  I, Insurance claims handler, CMA, am acting as transcriptionist for Helane Rima, DO  I have reviewed the above documentation for accuracy and completeness, and I agree with the above. Helane Rima, DO

## 2020-10-28 ENCOUNTER — Encounter: Payer: 59 | Admitting: Family

## 2020-11-06 ENCOUNTER — Other Ambulatory Visit: Payer: Self-pay

## 2020-11-06 ENCOUNTER — Ambulatory Visit (INDEPENDENT_AMBULATORY_CARE_PROVIDER_SITE_OTHER): Payer: 59 | Admitting: Family Medicine

## 2020-11-06 ENCOUNTER — Encounter (INDEPENDENT_AMBULATORY_CARE_PROVIDER_SITE_OTHER): Payer: Self-pay | Admitting: Family Medicine

## 2020-11-06 VITALS — BP 133/80 | HR 76 | Temp 97.8°F | Ht 64.0 in | Wt 272.0 lb

## 2020-11-06 DIAGNOSIS — F5102 Adjustment insomnia: Secondary | ICD-10-CM

## 2020-11-06 DIAGNOSIS — E559 Vitamin D deficiency, unspecified: Secondary | ICD-10-CM

## 2020-11-06 DIAGNOSIS — E1169 Type 2 diabetes mellitus with other specified complication: Secondary | ICD-10-CM | POA: Diagnosis not present

## 2020-11-06 DIAGNOSIS — Z9189 Other specified personal risk factors, not elsewhere classified: Secondary | ICD-10-CM

## 2020-11-06 DIAGNOSIS — F32A Depression, unspecified: Secondary | ICD-10-CM

## 2020-11-06 DIAGNOSIS — Z6841 Body Mass Index (BMI) 40.0 and over, adult: Secondary | ICD-10-CM

## 2020-11-06 DIAGNOSIS — F419 Anxiety disorder, unspecified: Secondary | ICD-10-CM

## 2020-11-06 MED ORDER — ESCITALOPRAM OXALATE 10 MG PO TABS: 10.0000 mg | ORAL_TABLET | Freq: Every day | ORAL | 0 refills | Status: DC

## 2020-11-06 MED ORDER — VITAMIN D (ERGOCALCIFEROL) 1.25 MG (50000 UNIT) PO CAPS
50000.0000 [IU] | ORAL_CAPSULE | ORAL | 0 refills | Status: DC
Start: 2020-11-06 — End: 2020-11-18

## 2020-11-12 ENCOUNTER — Encounter (INDEPENDENT_AMBULATORY_CARE_PROVIDER_SITE_OTHER): Payer: Self-pay | Admitting: Family Medicine

## 2020-11-13 NOTE — Telephone Encounter (Signed)
Last seen by Dr. Wallace. 

## 2020-11-13 NOTE — Progress Notes (Signed)
Chief Complaint:   OBESITY Anita Webb is here to discuss her progress with her obesity treatment plan along with follow-up of her obesity related diagnoses.   Today's visit was #: 4 Starting weight: 308 lbs Starting date: 08/29/2020 Today's weight: 272 lbs Today's date: 11/06/2020 Total lbs lost to date: 36 lbs Body mass index is 46.69 kg/m.  Total weight loss percentage to date: -11.69%  Interim History:  Anita Webb's fasting blood glucose over 3 days:  124, 107, 99, 105.  Her 2 hour postprandial low was in the 120s.  Current Meal Plan: the Category 4 Plan for 98% of the time.  Current Exercise Plan:  Walking/hiking for 30 minutes 1 time per week.  Assessment/Plan:   1. Vitamin D deficiency Not at goal. Current vitamin D is 27.0, tested on 08/29/2020. Optimal goal > 50 ng/dL.  Plan is taking vitamin D 50,000 IU weekly.  Plan: Continue to take prescription Vitamin D @50 ,000 IU every week as prescribed.  Follow-up for routine testing of Vitamin D, at least 2-3 times per year to avoid over-replacement.  - Refill Vitamin D, Ergocalciferol, (DRISDOL) 1.25 MG (50000 UNIT) CAPS capsule; Take 1 capsule (50,000 Units total) by mouth every 7 (seven) days.  Dispense: 4 capsule; Refill: 0  2. Type 2 diabetes mellitus with other specified complication, without long-term current use of insulin (HCC) Diabetes Mellitus: Not at goal. Medication: None. Issues reviewed: blood sugar goals, complications of diabetes mellitus, hypoglycemia prevention and treatment, exercise, and nutrition.   Plan: The patient was encouraged to monitor blood sugars regularly and to bring their log to the next appointment for review.  The importance of regular follow up with PCP and all other specialists as scheduled was stressed to patient today.  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   Plan: Recommend sleep hygiene measures including regular sleep schedule, optimal sleep environment, and relaxing presleep rituals.   4. Anxiety and depression Behavior modification techniques were discussed today to help Anita Webb deal with her anxiety.  After discussion, patient would like to start below medication. Expectations, risks, and potential side effects reviewed.   - Start escitalopram (LEXAPRO) 10 MG tablet; Take 1 tablet (10 mg total) by mouth daily.  Dispense: 30 tablet; Refill: 0  5. At risk for activity intolerance Anita Webb was given approximately 8 minutes of counseling today regarding her increased risk for exercise intolerance.  We discussed patient's specific personal and medical issues that raise our concern.  She was advised of strategies to prevent injury and ways to improve her cardiopulmonary fitness levels slowly over time.  We additionally discussed various fitness trackers and smart phone apps to help motivate the patient to stay on track.   6. Class 3 severe obesity with serious comorbidity and body mass index (BMI) of 45.0 to 49.9 in adult, unspecified obesity type (HCC)  Course: Anita Webb is currently in the action stage of change. As such, her goal is to continue with weight loss efforts.   Nutrition goals: She has agreed to the Category 4 Plan.   Exercise goals: For substantial health benefits, adults should do at least 150 minutes (2 hours and 30 minutes) a week of moderate-intensity, or 75 minutes (1 hour and 15 minutes)  a week of vigorous-intensity aerobic physical activity, or an equivalent combination of moderate- and vigorous-intensity aerobic activity. Aerobic activity should be performed in episodes of at least 10 minutes, and preferably, it should be spread throughout the week.  Behavioral modification strategies: increasing lean protein intake, decreasing simple  carbohydrates, increasing vegetables and increasing water intake.  Anita Webb has agreed to follow-up with our clinic in 3 weeks. She was informed of the importance of frequent follow-up visits to maximize her success with intensive lifestyle modifications for her multiple health conditions.   Objective:   Blood pressure 133/80, pulse 76, temperature 97.8 F (36.6 C), temperature source Oral, height 5\' 4"  (1.626 m), weight 272 lb (123.4 kg), SpO2 99 %. Body mass index is 46.69 kg/m.  General: Cooperative, alert, well developed, in no acute distress. HEENT: Conjunctivae and lids unremarkable. Cardiovascular: Regular rhythm.  Lungs: Normal work of breathing. Neurologic: No focal deficits.   Lab Results  Component Value Date   CREATININE 0.70 08/29/2020   BUN 9 08/29/2020   NA 137 08/29/2020   K 4.0 08/29/2020   CL 98 08/29/2020   CO2 21 08/29/2020   Lab Results  Component Value Date   ALT 30 08/29/2020   AST 35 08/29/2020   ALKPHOS 64 08/29/2020   BILITOT 0.3 08/29/2020   Lab Results  Component Value Date   HGBA1C 6.8 (H) 08/29/2020   HGBA1C 5.7 05/22/2019   HGBA1C 5.7 08/29/2018   HGBA1C 6.1 06/24/2018   Lab Results  Component Value Date   INSULIN 32.2 (H) 08/29/2020   Lab Results  Component Value Date   TSH 3.150 08/29/2020   Lab Results  Component Value Date   CHOL 202 (H) 08/29/2020   HDL 36 (L) 08/29/2020   LDLCALC 121 (H) 08/29/2020   LDLDIRECT 100.0 05/22/2019   TRIG 258 (H) 08/29/2020   CHOLHDL 5.6 (H) 08/29/2020   Lab Results  Component Value Date   WBC 6.9 08/29/2020   HGB 14.6 08/29/2020   HCT 42.6 08/29/2020   MCV 84 08/29/2020   PLT 195 08/29/2020   Attestation Statements:   Reviewed by clinician on day of visit: allergies, medications, problem list, medical history, surgical history, family history, social history, and previous encounter notes.  I, 10/27/2020, CMA, am acting as transcriptionist for Insurance claims handler, DO  I have reviewed the  above documentation for accuracy and completeness, and I agree with the above. Helane Rima, DO

## 2020-11-14 MED ORDER — LOSARTAN POTASSIUM-HCTZ 50-12.5 MG PO TABS: 0.5000 | ORAL_TABLET | Freq: Every day | ORAL | 0 refills | Status: DC

## 2020-11-18 ENCOUNTER — Encounter (INDEPENDENT_AMBULATORY_CARE_PROVIDER_SITE_OTHER): Payer: Self-pay | Admitting: Family Medicine

## 2020-11-18 ENCOUNTER — Other Ambulatory Visit: Payer: Self-pay

## 2020-11-18 ENCOUNTER — Ambulatory Visit (INDEPENDENT_AMBULATORY_CARE_PROVIDER_SITE_OTHER): Payer: 59 | Admitting: Family Medicine

## 2020-11-18 VITALS — BP 130/77 | HR 60 | Temp 98.3°F | Ht 64.0 in | Wt 268.0 lb

## 2020-11-18 DIAGNOSIS — Z6841 Body Mass Index (BMI) 40.0 and over, adult: Secondary | ICD-10-CM

## 2020-11-18 DIAGNOSIS — E1169 Type 2 diabetes mellitus with other specified complication: Secondary | ICD-10-CM

## 2020-11-18 DIAGNOSIS — E559 Vitamin D deficiency, unspecified: Secondary | ICD-10-CM | POA: Diagnosis not present

## 2020-11-18 DIAGNOSIS — F411 Generalized anxiety disorder: Secondary | ICD-10-CM | POA: Diagnosis not present

## 2020-11-18 MED ORDER — VITAMIN D (ERGOCALCIFEROL) 1.25 MG (50000 UNIT) PO CAPS: 50000.0000 [IU] | ORAL_CAPSULE | ORAL | 0 refills | Status: DC

## 2020-11-18 MED ORDER — ESCITALOPRAM OXALATE 10 MG PO TABS: 10.0000 mg | ORAL_TABLET | Freq: Every day | ORAL | 0 refills | Status: DC

## 2020-11-27 NOTE — Progress Notes (Signed)
Chief Complaint:   OBESITY Anita Webb is here to discuss her progress with her obesity treatment plan along with follow-up of her obesity related diagnoses.   Today's visit was #: 5 Starting weight: 308 lbs Starting date: 08/29/2020 Today's weight: 268 lbs Today's date: 11/18/2020 Total lbs lost to date: 40 lbs Body mass index is 46 kg/m.  Total weight loss percentage to date: -12.99%  Interim History:  Tikita says that Lexapro is "amazing".  She has been sleeping better and has been having less obsessive thoughts.  She says that her children have been more involved in her plan. Current Meal Plan: the Category 4 Plan for 98% of the time.  Current Exercise Plan: NEAT.  Assessment/Plan:   1. Type 2 diabetes mellitus with other specified complication, without long-term current use of insulin (HCC) Diabetes Mellitus: Not at goal. Medication: None. Issues reviewed: blood sugar goals, complications of diabetes mellitus, hypoglycemia prevention and treatment, exercise, and nutrition.   Plan: The importance of regular follow up with PCP and all other specialists as scheduled was stressed to patient today.  Lab Results  Component Value Date   HGBA1C 6.8 (H) 08/29/2020   HGBA1C 5.7 05/22/2019   HGBA1C 5.7 08/29/2018   Lab Results  Component Value Date   LDLCALC 121 (H) 08/29/2020   CREATININE 0.70 08/29/2020   2. Vitamin D deficiency Not at goal. Current vitamin D is 27.0, tested on 08/29/2020. Optimal goal > 50 ng/dL.  She is taking vitamin D 50,000 IU weekly. Plan: Continue to take prescription Vitamin D @50 ,000 IU every week as prescribed.  Follow-up for routine testing of Vitamin D, at least 2-3 times per year to avoid over-replacement.  - Refill Vitamin D, Ergocalciferol, (DRISDOL) 1.25 MG (50000 UNIT) CAPS capsule; Take 1 capsule (50,000 Units total) by mouth every 7 (seven) days.  Dispense: 4 capsule; Refill: 0  3. GAD (generalized anxiety disorder) Temisha is taking Lexapro 10 mg  daily for anxiety.  She says it is working well for her. Plan:  Continue Lexapro.  Will refill today, as per below.  Behavior modification techniques were discussed today to help Ketzia deal with her anxiety.   - Refill escitalopram (LEXAPRO) 10 MG tablet; Take 1 tablet (10 mg total) by mouth daily.  Dispense: 90 tablet; Refill: 0  4. Obesity, current BMI 46  Course: Talyia is currently in the action stage of change. As such, her goal is to continue with weight loss efforts.   Nutrition goals: She has agreed to the Category 4 Plan.   Exercise goals: Increase NEAT.  Behavioral modification strategies: increasing lean protein intake, decreasing simple carbohydrates, increasing vegetables, increasing water intake and decreasing liquid calories.  Tawni has agreed to follow-up with our clinic in 2-3 weeks. She was informed of the importance of frequent follow-up visits to maximize her success with intensive lifestyle modifications for her multiple health conditions.   Objective:   Blood pressure 130/77, pulse 60, temperature 98.3 F (36.8 C), temperature source Oral, height 5\' 4"  (1.626 m), weight 268 lb (121.6 kg), SpO2 97 %. Body mass index is 46 kg/m.  General: Cooperative, alert, well developed, in no acute distress. HEENT: Conjunctivae and lids unremarkable. Cardiovascular: Regular rhythm.  Lungs: Normal work of breathing. Neurologic: No focal deficits.   Lab Results  Component Value Date   CREATININE 0.70 08/29/2020   BUN 9 08/29/2020   NA 137 08/29/2020   K 4.0 08/29/2020   CL 98 08/29/2020   CO2 21 08/29/2020  Lab Results  Component Value Date   ALT 30 08/29/2020   AST 35 08/29/2020   ALKPHOS 64 08/29/2020   BILITOT 0.3 08/29/2020   Lab Results  Component Value Date   HGBA1C 6.8 (H) 08/29/2020   HGBA1C 5.7 05/22/2019   HGBA1C 5.7 08/29/2018   HGBA1C 6.1 06/24/2018   Lab Results  Component Value Date   INSULIN 32.2 (H) 08/29/2020   Lab Results  Component Value  Date   TSH 3.150 08/29/2020   Lab Results  Component Value Date   CHOL 202 (H) 08/29/2020   HDL 36 (L) 08/29/2020   LDLCALC 121 (H) 08/29/2020   LDLDIRECT 100.0 05/22/2019   TRIG 258 (H) 08/29/2020   CHOLHDL 5.6 (H) 08/29/2020   Lab Results  Component Value Date   WBC 6.9 08/29/2020   HGB 14.6 08/29/2020   HCT 42.6 08/29/2020   MCV 84 08/29/2020   PLT 195 08/29/2020   Attestation Statements:   Reviewed by clinician on day of visit: allergies, medications, problem list, medical history, surgical history, family history, social history, and previous encounter notes.  I, Insurance claims handler, CMA, am acting as transcriptionist for Helane Rima, DO  I have reviewed the above documentation for accuracy and completeness, and I agree with the above. Helane Rima, DO

## 2020-12-09 ENCOUNTER — Other Ambulatory Visit: Payer: Self-pay

## 2020-12-09 ENCOUNTER — Encounter (INDEPENDENT_AMBULATORY_CARE_PROVIDER_SITE_OTHER): Payer: Self-pay | Admitting: Family Medicine

## 2020-12-09 ENCOUNTER — Ambulatory Visit (INDEPENDENT_AMBULATORY_CARE_PROVIDER_SITE_OTHER): Payer: 59 | Admitting: Family Medicine

## 2020-12-09 VITALS — BP 136/82 | HR 92 | Temp 97.9°F | Ht 64.0 in | Wt 258.0 lb

## 2020-12-09 DIAGNOSIS — Z6841 Body Mass Index (BMI) 40.0 and over, adult: Secondary | ICD-10-CM

## 2020-12-09 DIAGNOSIS — E1169 Type 2 diabetes mellitus with other specified complication: Secondary | ICD-10-CM

## 2020-12-09 DIAGNOSIS — E1159 Type 2 diabetes mellitus with other circulatory complications: Secondary | ICD-10-CM

## 2020-12-09 DIAGNOSIS — E66813 Obesity, class 3: Secondary | ICD-10-CM

## 2020-12-09 DIAGNOSIS — F411 Generalized anxiety disorder: Secondary | ICD-10-CM

## 2020-12-09 DIAGNOSIS — I152 Hypertension secondary to endocrine disorders: Secondary | ICD-10-CM

## 2020-12-09 MED ORDER — LOSARTAN POTASSIUM-HCTZ 50-12.5 MG PO TABS: 0.5000 | ORAL_TABLET | Freq: Every day | ORAL | 0 refills | Status: DC

## 2020-12-10 NOTE — Progress Notes (Signed)
Chief Complaint:   OBESITY Anita Webb is here to discuss her progress with her obesity treatment plan along with follow-up of her obesity related diagnoses. Anita Webb is on the Category 4 Plan and states she is following her eating plan approximately 97% of the time. Anita Webb states she is walking and hiking 30-60 minutes 2 times per week.  Today's visit was #: 6 Starting weight: 308 lbs Starting date: 08/29/2020 Today's weight: 258 lbs Today's date: 12/09/2020 Total lbs lost to date: 50 Total lbs lost since last in-office visit: 10  Interim History: Anita Webb has done a great job on our plan and is down 50 lbs since Jan. She notes she has not felt hungry or had cravings on the plan. She is subbing a protein bar for lunch at times.  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  Subjective:   1. GAD (generalized anxiety disorder) Anita Webb feels the Lexapro is helping quite a bit with anxiety. Her mood is very stable.  2. Hypertension associated with type 2 diabetes mellitus (HCC) Anita Webb's BP is well controlled on losartan-HCTZ. BP Readings from Last 3 Encounters:  12/09/20 136/82  11/18/20 130/77  11/06/20 133/80     3. Type 2 diabetes mellitus with other specified complication, without long-term current use of insulin (HCC) Anita Webb's diabetes is well controlled. Her last A1c was 6.8. She is not on medication. CBG's fasting average 100 and 2 hours post-prandial 120-130. Lab Results  Component Value Date   HGBA1C 6.8 (H) 08/29/2020   HGBA1C 5.7 05/22/2019   HGBA1C 5.7 08/29/2018   Lab Results  Component Value Date   LDLCALC 121 (H) 08/29/2020   CREATININE 0.70 08/29/2020    Assessment/Plan:   1. GAD (generalized anxiety disorder)  Continue Lexapro.  2. Hypertension associated with type 2 diabetes mellitus (HCC) Refill:  - losartan-hydrochlorothiazide (HYZAAR) 50-12.5 MG tablet; Take 0.5 tablets by mouth daily.  Dispense: 45 tablet; Refill: 0  3. Type 2 diabetes mellitus with  other specified complication, without long-term current use of insulin (HCC)  Continue meal plan.  4. Obesity, current BMI 44 Anita Webb is currently in the action stage of change. As such, her goal is to continue with weight loss efforts. She has agreed to the Category 4 Plan and keeping a food journal and adhering to recommended goals of 550-70 calories and 45 grams protein with supper.   Handouts: Journaling, Recipes, and Recipes II  Exercise goals: As is  Behavioral modification strategies: meal planning and cooking strategies.  Anita Webb has agreed to follow-up with our clinic in 2-3 weeks with Dr. Earlene Plater.  Objective:   Blood pressure 136/82, pulse 92, temperature 97.9 F (36.6 C), height 5\' 4"  (1.626 m), weight 258 lb (117 kg), SpO2 99 %. Body mass index is 44.29 kg/m.  General: Cooperative, alert, well developed, in no acute distress. HEENT: Conjunctivae and lids unremarkable. Cardiovascular: Regular rhythm.  Lungs: Normal work of breathing. Neurologic: No focal deficits.   Lab Results  Component Value Date   CREATININE 0.70 08/29/2020   BUN 9 08/29/2020   NA 137 08/29/2020   K 4.0 08/29/2020   CL 98 08/29/2020   CO2 21 08/29/2020   Lab Results  Component Value Date   ALT 30 08/29/2020   AST 35 08/29/2020   ALKPHOS 64 08/29/2020   BILITOT 0.3 08/29/2020   Lab Results  Component Value Date   HGBA1C 6.8 (H) 08/29/2020   HGBA1C 5.7 05/22/2019   HGBA1C 5.7 08/29/2018   HGBA1C  6.1 06/24/2018   Lab Results  Component Value Date   INSULIN 32.2 (H) 08/29/2020   Lab Results  Component Value Date   TSH 3.150 08/29/2020   Lab Results  Component Value Date   CHOL 202 (H) 08/29/2020   HDL 36 (L) 08/29/2020   LDLCALC 121 (H) 08/29/2020   LDLDIRECT 100.0 05/22/2019   TRIG 258 (H) 08/29/2020   CHOLHDL 5.6 (H) 08/29/2020   Lab Results  Component Value Date   WBC 6.9 08/29/2020   HGB 14.6 08/29/2020   HCT 42.6 08/29/2020   MCV 84 08/29/2020   PLT 195 08/29/2020     Attestation Statements:   Reviewed by clinician on day of visit: allergies, medications, problem list, medical history, surgical history, family history, social history, and previous encounter notes.  Edmund Hilda, am acting as Energy manager for [place], FNP.  I have reviewed the above documentation for accuracy and completeness, and I agree with the above. -  Jesse Sans, FNP

## 2020-12-11 ENCOUNTER — Encounter: Payer: 59 | Admitting: Family

## 2020-12-11 ENCOUNTER — Encounter (INDEPENDENT_AMBULATORY_CARE_PROVIDER_SITE_OTHER): Payer: Self-pay | Admitting: Family Medicine

## 2020-12-11 DIAGNOSIS — F411 Generalized anxiety disorder: Secondary | ICD-10-CM | POA: Insufficient documentation

## 2020-12-17 ENCOUNTER — Ambulatory Visit (INDEPENDENT_AMBULATORY_CARE_PROVIDER_SITE_OTHER): Payer: 59 | Admitting: Family Medicine

## 2020-12-31 ENCOUNTER — Encounter (INDEPENDENT_AMBULATORY_CARE_PROVIDER_SITE_OTHER): Payer: Self-pay

## 2020-12-31 ENCOUNTER — Other Ambulatory Visit: Payer: Self-pay

## 2020-12-31 ENCOUNTER — Telehealth (INDEPENDENT_AMBULATORY_CARE_PROVIDER_SITE_OTHER): Payer: Self-pay

## 2020-12-31 ENCOUNTER — Ambulatory Visit (INDEPENDENT_AMBULATORY_CARE_PROVIDER_SITE_OTHER): Payer: 59 | Admitting: Adult Health

## 2020-12-31 ENCOUNTER — Encounter (INDEPENDENT_AMBULATORY_CARE_PROVIDER_SITE_OTHER): Payer: Self-pay | Admitting: Adult Health

## 2020-12-31 VITALS — BP 117/75 | HR 61 | Temp 97.7°F | Ht 64.0 in | Wt 252.0 lb

## 2020-12-31 DIAGNOSIS — Z9189 Other specified personal risk factors, not elsewhere classified: Secondary | ICD-10-CM | POA: Diagnosis not present

## 2020-12-31 DIAGNOSIS — E1159 Type 2 diabetes mellitus with other circulatory complications: Secondary | ICD-10-CM | POA: Diagnosis not present

## 2020-12-31 DIAGNOSIS — F411 Generalized anxiety disorder: Secondary | ICD-10-CM

## 2020-12-31 DIAGNOSIS — E559 Vitamin D deficiency, unspecified: Secondary | ICD-10-CM

## 2020-12-31 DIAGNOSIS — I152 Hypertension secondary to endocrine disorders: Secondary | ICD-10-CM

## 2020-12-31 DIAGNOSIS — Z6841 Body Mass Index (BMI) 40.0 and over, adult: Secondary | ICD-10-CM

## 2020-12-31 MED ORDER — VITAMIN D (ERGOCALCIFEROL) 1.25 MG (50000 UNIT) PO CAPS: 50000.0000 [IU] | ORAL_CAPSULE | ORAL | 0 refills | Status: DC

## 2020-12-31 MED ORDER — LOSARTAN POTASSIUM-HCTZ 50-12.5 MG PO TABS
0.5000 | ORAL_TABLET | Freq: Every day | ORAL | 0 refills | Status: DC
Start: 2020-12-31 — End: 2021-02-19

## 2020-12-31 MED ORDER — ESCITALOPRAM OXALATE 10 MG PO TABS
10.0000 mg | ORAL_TABLET | Freq: Every day | ORAL | 0 refills | Status: DC
Start: 2020-12-31 — End: 2021-03-13

## 2021-01-01 NOTE — Progress Notes (Signed)
Chief Complaint:   OBESITY Anita Webb is here to discuss her progress with her obesity treatment plan along with follow-up of her obesity related diagnoses. Luvena is on the Category 4 Plan and keeping a food journal and adhering to recommended goals of 550-700 calories and 45 g protein and states she is following her eating plan approximately 95% of the time. Kelce states she is doing cardio 30-60 minutes 2 times per week.  Today's visit was #: 7 Starting weight: 308 lbs Starting date: 08/29/2020 Today's weight: 252 lbs Today's date: 12/31/2020 Total lbs lost to date: 56 lbs Total lbs lost since last in-office visit: 6  Interim History: Anita Webb has had tremendous success since starting the program January 2022.  She lost a total of 56 lbs!  Her mother has early onset Dementia- Dx'd at age 25, currently age 47. She and her father are the caregivers for her mother. Of Note: Mother had thyroidectomy due to pre-cancerous thyroid mass.  Subjective:   1. Hypertension associated with type 2 diabetes mellitus (HCC) Anita Webb is on Hyzaar 50/12.5 mg 1/2 tab QD. BP/HR excellent at OV.  BP Readings from Last 3 Encounters:  12/31/20 117/75  12/09/20 136/82  11/18/20 130/77   2. Vitamin D deficiency Anita Webb's Vitamin D level was 27.0 on 08/29/2020, which is well below goal of 50.Marland Kitchen She is currently taking prescription vitamin D 50,000 IU each week. She denies nausea, vomiting or muscle weakness.  3. GAD (generalized anxiety disorder) Anita Webb reports her mood is stable on Lexapro 10 mg. Pt denies suicidal or homicidal ideations.  4. At risk for heart disease Anita Webb is at a higher than average risk for cardiovascular disease due to obesity and hypertension.  Assessment/Plan:   1. Hypertension associated with type 2 diabetes mellitus (HCC) Terika is working on healthy weight loss and exercise to improve blood pressure control. We will watch for signs of hypotension as she continues her lifestyle modifications. Check  labs at next OV.  - losartan-hydrochlorothiazide (HYZAAR) 50-12.5 MG tablet; Take 0.5 tablets by mouth daily.  Dispense: 45 tablet; Refill: 0  2. Vitamin D deficiency Low Vitamin D level contributes to fatigue and are associated with obesity, breast, and colon cancer. She agrees to continue to take prescription Vitamin D @50 ,000 IU every week and will follow-up for routine testing of Vitamin D, at least 2-3 times per year to avoid over-replacement. Check labs at next OV.  - Vitamin D, Ergocalciferol, (DRISDOL) 1.25 MG (50000 UNIT) CAPS capsule; Take 1 capsule (50,000 Units total) by mouth every 7 (seven) days.  Dispense: 4 capsule; Refill: 0  3. GAD (generalized anxiety disorder) Behavior modification techniques were discussed today to help Nohemi deal with her anxiety.  Orders and follow up as documented in patient record.   - escitalopram (LEXAPRO) 10 MG tablet; Take 1 tablet (10 mg total) by mouth daily.  Dispense: 90 tablet; Refill: 0  4. At risk for heart disease Anita Webb was given approximately 15 minutes of coronary artery disease prevention counseling today. She is 40 y.o. female and has risk factors for heart disease including obesity. We discussed intensive lifestyle modifications today with an emphasis on specific weight loss instructions and strategies.   Repetitive spaced learning was employed today to elicit superior memory formation and behavioral change.  5. Class 3 severe obesity with serious comorbidity and body mass index (BMI) of 40.0 to 44.9 in adult, unspecified obesity type (HCC)  Anita Webb is currently in the action stage of change. As such,  her goal is to continue with weight loss efforts. She has agreed to the Category 4 Plan and keeping a food journal and adhering to recommended goals of 550-700 calories and 45 g protein at supper.   Check fasting labs at next OV.  Exercise goals: As is  Behavioral modification strategies: increasing lean protein intake, decreasing simple  carbohydrates, meal planning and cooking strategies and planning for success.  Anita Webb has agreed to follow-up with our clinic, fasting, in 3 weeks. She was informed of the importance of frequent follow-up visits to maximize her success with intensive lifestyle modifications for her multiple health conditions.   Objective:   Blood pressure 117/75, pulse 61, temperature 97.7 F (36.5 C), height 5\' 4"  (1.626 m), weight 252 lb (114.3 kg), SpO2 98 %. Body mass index is 43.26 kg/m.  General: Cooperative, alert, well developed, in no acute distress. HEENT: Conjunctivae and lids unremarkable. Cardiovascular: Regular rhythm.  Lungs: Normal work of breathing. Neurologic: No focal deficits.   Lab Results  Component Value Date   CREATININE 0.70 08/29/2020   BUN 9 08/29/2020   NA 137 08/29/2020   K 4.0 08/29/2020   CL 98 08/29/2020   CO2 21 08/29/2020   Lab Results  Component Value Date   ALT 30 08/29/2020   AST 35 08/29/2020   ALKPHOS 64 08/29/2020   BILITOT 0.3 08/29/2020   Lab Results  Component Value Date   HGBA1C 6.8 (H) 08/29/2020   HGBA1C 5.7 05/22/2019   HGBA1C 5.7 08/29/2018   HGBA1C 6.1 06/24/2018   Lab Results  Component Value Date   INSULIN 32.2 (H) 08/29/2020   Lab Results  Component Value Date   TSH 3.150 08/29/2020   Lab Results  Component Value Date   CHOL 202 (H) 08/29/2020   HDL 36 (L) 08/29/2020   LDLCALC 121 (H) 08/29/2020   LDLDIRECT 100.0 05/22/2019   TRIG 258 (H) 08/29/2020   CHOLHDL 5.6 (H) 08/29/2020   Lab Results  Component Value Date   WBC 6.9 08/29/2020   HGB 14.6 08/29/2020   HCT 42.6 08/29/2020   MCV 84 08/29/2020   PLT 195 08/29/2020     Attestation Statements:   Reviewed by clinician on day of visit: allergies, medications, problem list, medical history, surgical history, family history, social history, and previous encounter notes.  10/27/2020, CMA, am acting as transcriptionist for Edmund Hilda, NP.  I have reviewed  the above documentation for accuracy and completeness, and I agree with the above. -  Marco Adelson d. Kelven Flater, NP-C

## 2021-01-23 ENCOUNTER — Encounter (INDEPENDENT_AMBULATORY_CARE_PROVIDER_SITE_OTHER): Payer: Self-pay | Admitting: Adult Health

## 2021-01-27 ENCOUNTER — Other Ambulatory Visit: Payer: Self-pay

## 2021-01-27 ENCOUNTER — Telehealth (INDEPENDENT_AMBULATORY_CARE_PROVIDER_SITE_OTHER): Payer: 59 | Admitting: Adult Health

## 2021-01-27 DIAGNOSIS — Z6841 Body Mass Index (BMI) 40.0 and over, adult: Secondary | ICD-10-CM | POA: Diagnosis not present

## 2021-01-27 DIAGNOSIS — E1169 Type 2 diabetes mellitus with other specified complication: Secondary | ICD-10-CM | POA: Diagnosis not present

## 2021-01-29 NOTE — Progress Notes (Signed)
TeleHealth Visit:  Due to the COVID-19 pandemic, this visit was completed with telemedicine (audio/video) technology to reduce patient and provider exposure as well as to preserve personal protective equipment.   Anita Webb has verbally consented to this TeleHealth visit. The patient is located at home, the provider is located at the [facility] office. The participants in this visit include the listed provider and patient. The visit was conducted today via video.  Chief Complaint: OBESITY Anita Webb is here to discuss her progress with her obesity treatment plan along with follow-up of her obesity related diagnoses. Anita Webb is on the Category 4 Plan and keeping a food journal and adhering to recommended goals of 550-700 calories and 45 g protein and states she is following her eating plan approximately 95% of the time. Anita Webb states she is not currently exercising, but walked 4 days for 30-60 minutes the first 2 weeks.  Today's visit was #: 8 Starting weight: 308 Starting date: 08/29/2020  Interim History: Anita Webb's son is acutely ill with fever and cough. She will have him COVID tested. He is not vaccinated with COVID-19 vaccination, but will receive vaccine after this acute illness resolves.  Subjective:   1. Type 2 diabetes mellitus with other specified complication, without long-term current use of insulin (HCC) 08/29/2020 A1c 6.8. Anita Webb has been on Trulicity but d/c'd due to rash.  She has been on Rybelsus- tolerated it well. Then experienced weight reagin- after stopping the Rx.  Last dose of oral GLP-1 was over 2 years ago. Mother had pre-cancerous thyroid and underwent thyroidectomy.  Lab Results  Component Value Date   HGBA1C 6.8 (H) 08/29/2020   HGBA1C 5.7 05/22/2019   HGBA1C 5.7 08/29/2018   Lab Results  Component Value Date   LDLCALC 121 (H) 08/29/2020   CREATININE 0.70 08/29/2020   Lab Results  Component Value Date   INSULIN 32.2 (H) 08/29/2020    Assessment/Plan:    1. Type 2 diabetes mellitus with other specified complication, without long-term current use of insulin (HCC) Good blood sugar control is important to decrease the likelihood of diabetic complications such as nephropathy, neuropathy, limb loss, blindness, coronary artery disease, and death. Intensive lifestyle modification including diet, exercise and weight loss are the first line of treatment for diabetes.  -Mother had pre-cancerous thyroid and underwent thyroidectomy. Anita Webb denies history of pancreatitis. -Check fasting labs at next OV.  2. Class 3 severe obesity with serious comorbidity and body mass index (BMI) of 40.0 to 44.9 in adult, unspecified obesity type (HCC)  Anita Webb is currently in the action stage of change. As such, her goal is to continue with weight loss efforts. She has agreed to the Category 4 Plan.   -Check fasting labs at next OV.  Exercise goals: Walk/Hike 30-60 min/day.  Behavioral modification strategies: increasing lean protein intake, decreasing simple carbohydrates, meal planning and cooking strategies, keeping healthy foods in the home and planning for success.  Anita Webb has agreed to follow-up with our clinic in 3 weeks- fasting. She was informed of the importance of frequent follow-up visits to maximize her success with intensive lifestyle modifications for her multiple health conditions.  Objective:   VITALS: Per patient if applicable, see vitals. GENERAL: Alert and in no acute distress. CARDIOPULMONARY: No increased WOB. Speaking in clear sentences.  PSYCH: Pleasant and cooperative. Speech normal rate and rhythm. Affect is appropriate. Insight and judgement are appropriate. Attention is focused, linear, and appropriate.  NEURO: Oriented as arrived to appointment on time with no  prompting.   Lab Results  Component Value Date   CREATININE 0.70 08/29/2020   BUN 9 08/29/2020   NA 137 08/29/2020   K 4.0 08/29/2020   CL 98 08/29/2020   CO2 21 08/29/2020    Lab Results  Component Value Date   ALT 30 08/29/2020   AST 35 08/29/2020   ALKPHOS 64 08/29/2020   BILITOT 0.3 08/29/2020   Lab Results  Component Value Date   HGBA1C 6.8 (H) 08/29/2020   HGBA1C 5.7 05/22/2019   HGBA1C 5.7 08/29/2018   HGBA1C 6.1 06/24/2018   Lab Results  Component Value Date   INSULIN 32.2 (H) 08/29/2020   Lab Results  Component Value Date   TSH 3.150 08/29/2020   Lab Results  Component Value Date   CHOL 202 (H) 08/29/2020   HDL 36 (L) 08/29/2020   LDLCALC 121 (H) 08/29/2020   LDLDIRECT 100.0 05/22/2019   TRIG 258 (H) 08/29/2020   CHOLHDL 5.6 (H) 08/29/2020   Lab Results  Component Value Date   WBC 6.9 08/29/2020   HGB 14.6 08/29/2020   HCT 42.6 08/29/2020   MCV 84 08/29/2020   PLT 195 08/29/2020   No results found for: IRON, TIBC, FERRITIN  Attestation Statements:   Reviewed by clinician on day of visit: allergies, medications, problem list, medical history, surgical history, family history, social history, and previous encounter notes.  Time spent on visit including pre-visit chart review and post-visit charting and care was 28 minutes.   Edmund Hilda, CMA, am acting as transcriptionist for William Hamburger, NP.  I have reviewed the above documentation for accuracy and completeness, and I agree with the above. - Philippa Vessey d. Kevyn Wengert, NP-C

## 2021-02-09 ENCOUNTER — Other Ambulatory Visit (INDEPENDENT_AMBULATORY_CARE_PROVIDER_SITE_OTHER): Payer: Self-pay | Admitting: Adult Health

## 2021-02-09 DIAGNOSIS — E559 Vitamin D deficiency, unspecified: Secondary | ICD-10-CM

## 2021-02-10 NOTE — Telephone Encounter (Signed)
Last seen Katy 

## 2021-02-19 ENCOUNTER — Ambulatory Visit (INDEPENDENT_AMBULATORY_CARE_PROVIDER_SITE_OTHER): Payer: 59 | Admitting: Adult Health

## 2021-02-19 ENCOUNTER — Other Ambulatory Visit: Payer: Self-pay

## 2021-02-19 ENCOUNTER — Encounter (INDEPENDENT_AMBULATORY_CARE_PROVIDER_SITE_OTHER): Payer: Self-pay | Admitting: Adult Health

## 2021-02-19 VITALS — BP 127/84 | HR 66 | Temp 98.0°F | Ht 64.0 in | Wt 235.0 lb

## 2021-02-19 DIAGNOSIS — E559 Vitamin D deficiency, unspecified: Secondary | ICD-10-CM | POA: Diagnosis not present

## 2021-02-19 DIAGNOSIS — E1159 Type 2 diabetes mellitus with other circulatory complications: Secondary | ICD-10-CM | POA: Diagnosis not present

## 2021-02-19 DIAGNOSIS — E785 Hyperlipidemia, unspecified: Secondary | ICD-10-CM

## 2021-02-19 DIAGNOSIS — Z6841 Body Mass Index (BMI) 40.0 and over, adult: Secondary | ICD-10-CM

## 2021-02-19 DIAGNOSIS — I152 Hypertension secondary to endocrine disorders: Secondary | ICD-10-CM

## 2021-02-19 DIAGNOSIS — E1169 Type 2 diabetes mellitus with other specified complication: Secondary | ICD-10-CM

## 2021-02-19 DIAGNOSIS — Z9189 Other specified personal risk factors, not elsewhere classified: Secondary | ICD-10-CM

## 2021-02-19 MED ORDER — LOSARTAN POTASSIUM-HCTZ 50-12.5 MG PO TABS: 0.5000 | ORAL_TABLET | Freq: Every day | ORAL | 0 refills | Status: DC

## 2021-02-19 MED ORDER — VITAMIN D (ERGOCALCIFEROL) 1.25 MG (50000 UNIT) PO CAPS: 50000.0000 [IU] | ORAL_CAPSULE | ORAL | 0 refills | Status: DC

## 2021-02-20 LAB — COMPREHENSIVE METABOLIC PANEL
ALT: 19 IU/L (ref 0–32)
AST: 21 IU/L (ref 0–40)
Albumin/Globulin Ratio: 1.9 (ref 1.2–2.2)
Albumin: 4.7 g/dL (ref 3.8–4.8)
Alkaline Phosphatase: 65 IU/L (ref 44–121)
BUN/Creatinine Ratio: 20 (ref 9–23)
BUN: 15 mg/dL (ref 6–24)
Bilirubin Total: 0.4 mg/dL (ref 0.0–1.2)
CO2: 23 mmol/L (ref 20–29)
Calcium: 9.4 mg/dL (ref 8.7–10.2)
Chloride: 102 mmol/L (ref 96–106)
Creatinine, Ser: 0.76 mg/dL (ref 0.57–1.00)
Globulin, Total: 2.5 g/dL (ref 1.5–4.5)
Glucose: 87 mg/dL (ref 65–99)
Potassium: 4.5 mmol/L (ref 3.5–5.2)
Sodium: 142 mmol/L (ref 134–144)
Total Protein: 7.2 g/dL (ref 6.0–8.5)
eGFR: 101 mL/min/{1.73_m2} (ref >59)

## 2021-02-20 LAB — VITAMIN D 25 HYDROXY (VIT D DEFICIENCY, FRACTURES): Vit D, 25-Hydroxy: 47.3 ng/mL (ref 30.0–100.0)

## 2021-02-20 LAB — LIPID PANEL
Chol/HDL Ratio: 5.1 ratio — ABNORMAL HIGH (ref 0.0–4.4)
Cholesterol, Total: 167 mg/dL (ref 100–199)
HDL: 33 mg/dL — ABNORMAL LOW (ref >39)
LDL Chol Calc (NIH): 111 mg/dL — ABNORMAL HIGH (ref 0–99)
Triglycerides: 127 mg/dL (ref 0–149)
VLDL Cholesterol Cal: 23 mg/dL (ref 5–40)

## 2021-02-20 LAB — HEMOGLOBIN A1C
Est. average glucose Bld gHb Est-mCnc: 111 mg/dL
Hgb A1c MFr Bld: 5.5 % (ref 4.8–5.6)

## 2021-02-20 LAB — INSULIN, RANDOM: INSULIN: 7.1 u[IU]/mL (ref 2.6–24.9)

## 2021-02-20 NOTE — Progress Notes (Signed)
Chief Complaint:   OBESITY Anita Webb is here to discuss her progress with her obesity treatment plan along with follow-up of her obesity related diagnoses. Anita Webb is on the Category 4 Plan and states she is following her eating plan approximately 95% of the time. Anita Webb states she is walking 30 minutes 2 times per week.  Today's visit was #: 9 Starting weight: 308 lbs Starting date: 08/29/2020 Today's weight: 235 Today's date: 02/19/2021 Total lbs lost to date: 73 Total lbs lost since last in-office visit: 17  Interim History: Anita Webb continues to enjoy the structure/foods on category 4 meal plan, however, she is concerned that her weight loss has slowed. She is down 17 lbs since last OV, for a total of 73 lbs.  Subjective:   1. Vitamin D deficiency She is currently taking prescription vitamin D 50,000 IU each week. She denies nausea, vomiting or muscle weakness. Vit D level 27.0 on 08/29/2020- well below goal of 50.  Lab Results  Component Value Date   VD25OH 47.3 02/19/2021   VD25OH 27.0 (L) 08/29/2020   VD25OH 31.65 05/22/2019   2. Hypertension associated with type 2 diabetes mellitus (HCC) Anita Webb started losartan/HCTZ 50-12.5 mg 1/2 tab on 09/12/2020.  BP/HR at goal today.  BP Readings from Last 3 Encounters:  02/19/21 127/84  12/31/20 117/75  12/09/20 136/82   3. Type 2 diabetes mellitus with other specified complication, without long-term current use of insulin (HCC) 08/29/2020 BG 95, A1c 6.8, and insulin level 32.2.  Anita Webb was previously on Rybelsus, Trulicity, Metformin (PCOS)- been off for >12 months. She felt that injectable GLP-1 was more effective with BG/weight control.  Her mother had pre-cancerous thyroid condition that required thyroidectomy- unsure of specific diagnosis.   4. Hyperlipidemia associated with type 2 diabetes mellitus (HCC) Anita Webb is not on statin therapy. Last LDL was well above goal for diabetic at 121. It has been recommended to her to follow up with PCP for  lipid tx.  5. At risk for heart disease Anita Webb is at a higher than average risk for cardiovascular disease due to obesity, HLD, HTN, type 2 diabetes.  Assessment/Plan:   1. Vitamin D deficiency Low Vitamin D level contributes to fatigue and are associated with obesity, breast, and colon cancer. She agrees to continue to take prescription Vitamin D @50 ,000 IU every week and will follow-up for routine testing of Vitamin D, at least 2-3 times per year to avoid over-replacement.  Refill- Vitamin D, Ergocalciferol, (DRISDOL) 1.25 MG (50000 UNIT) CAPS capsule; Take 1 capsule (50,000 Units total) by mouth every 7 (seven) days.  Dispense: 4 capsule; Refill: 0  Check labs today. - VITAMIN D 25 Hydroxy (Vit-D Deficiency, Fractures)  2. Hypertension associated with type 2 diabetes mellitus (HCC) Sarahy is working on healthy weight loss and exercise to improve blood pressure control. We will watch for signs of hypotension as she continues her lifestyle modifications.  Refill- losartan-hydrochlorothiazide (HYZAAR) 50-12.5 MG tablet; Take 0.5 tablets by mouth daily.  Dispense: 45 tablet; Refill: 0  Check labs today. - Comprehensive metabolic panel  3. Type 2 diabetes mellitus with other specified complication, without long-term current use of insulin (HCC) Good blood sugar control is important to decrease the likelihood of diabetic complications such as nephropathy, neuropathy, limb loss, blindness, coronary artery disease, and death. Intensive lifestyle modification including diet, exercise and weight loss are the first line of treatment for diabetes.  Inquire about specific diagnosis of mother's thyroid cancer. Discuss treatment after labs at next  OV. Check labs today. - Hemoglobin A1c - Insulin, random  4. Hyperlipidemia associated with type 2 diabetes mellitus (HCC) Cardiovascular risk and specific lipid/LDL goals reviewed.  We discussed several lifestyle modifications today and Alta will continue  to work on diet, exercise and weight loss efforts. Orders and follow up as documented in patient record.  Follow up with PCP for lipid treatment.  Counseling Intensive lifestyle modifications are the first line treatment for this issue. Dietary changes: Increase soluble fiber. Decrease simple carbohydrates. Exercise changes: Moderate to vigorous-intensity aerobic activity 150 minutes per week if tolerated. Lipid-lowering medications: see documented in medical record. Check labs today. - Lipid panel  5. At risk for heart disease Anita Webb was given approximately 15 minutes of coronary artery disease prevention counseling today. She is 40 y.o. female and has risk factors for heart disease including obesity. We discussed intensive lifestyle modifications today with an emphasis on specific weight loss instructions and strategies.   Repetitive spaced learning was employed today to elicit superior memory formation and behavioral change.   6. Obesity, current BMI 40  Anita Webb is currently in the action stage of change. As such, her goal is to continue with weight loss efforts. She has agreed to the Category 4 Plan.   Exercise goals:  As is  Behavioral modification strategies: increasing lean protein intake, decreasing simple carbohydrates, meal planning and cooking strategies, keeping healthy foods in the home, and planning for success.  Anita Webb has agreed to follow-up with our clinic in 3 weeks with Dr. Sharee Holster. She was informed of the importance of frequent follow-up visits to maximize her success with intensive lifestyle modifications for her multiple health conditions.   Anita Webb was informed we would discuss her lab results at her next visit unless there is a critical issue that needs to be addressed sooner. Anita Webb agreed to keep her next visit at the agreed upon time to discuss these results.  Objective:   Blood pressure 127/84, pulse 66, temperature 98 F (36.7 C), height 5\' 4"  (1.626 m), weight 235 lb  (106.6 kg), SpO2 97 %. Body mass index is 40.34 kg/m.  General: Cooperative, alert, well developed, in no acute distress. HEENT: Conjunctivae and lids unremarkable. Cardiovascular: Regular rhythm.  Lungs: Normal work of breathing. Neurologic: No focal deficits.   Lab Results  Component Value Date   CREATININE 0.76 02/19/2021   BUN 15 02/19/2021   NA 142 02/19/2021   K 4.5 02/19/2021   CL 102 02/19/2021   CO2 23 02/19/2021   Lab Results  Component Value Date   ALT 19 02/19/2021   AST 21 02/19/2021   ALKPHOS 65 02/19/2021   BILITOT 0.4 02/19/2021   Lab Results  Component Value Date   HGBA1C 5.5 02/19/2021   HGBA1C 6.8 (H) 08/29/2020   HGBA1C 5.7 05/22/2019   HGBA1C 5.7 08/29/2018   HGBA1C 6.1 06/24/2018   Lab Results  Component Value Date   INSULIN 7.1 02/19/2021   INSULIN 32.2 (H) 08/29/2020   Lab Results  Component Value Date   TSH 3.150 08/29/2020   Lab Results  Component Value Date   CHOL 167 02/19/2021   HDL 33 (L) 02/19/2021   LDLCALC 111 (H) 02/19/2021   LDLDIRECT 100.0 05/22/2019   TRIG 127 02/19/2021   CHOLHDL 5.1 (H) 02/19/2021   Lab Results  Component Value Date   VD25OH 47.3 02/19/2021   VD25OH 27.0 (L) 08/29/2020   VD25OH 31.65 05/22/2019   Lab Results  Component Value Date   WBC 6.9 08/29/2020  HGB 14.6 08/29/2020   HCT 42.6 08/29/2020   MCV 84 08/29/2020   PLT 195 08/29/2020   No results found for: IRON, TIBC, FERRITIN  Attestation Statements:   Reviewed by clinician on day of visit: allergies, medications, problem list, medical history, surgical history, family history, social history, and previous encounter notes.  Edmund Hilda, CMA, am acting as transcriptionist for William Hamburger, NP.  I have reviewed the above documentation for accuracy and completeness, and I agree with the above. -  Avinash Maltos d. Destiney Sanabia,NP-C

## 2021-03-13 ENCOUNTER — Encounter (INDEPENDENT_AMBULATORY_CARE_PROVIDER_SITE_OTHER): Payer: Self-pay | Admitting: Family Medicine

## 2021-03-13 ENCOUNTER — Ambulatory Visit (INDEPENDENT_AMBULATORY_CARE_PROVIDER_SITE_OTHER): Payer: 59 | Admitting: Family Medicine

## 2021-03-13 ENCOUNTER — Other Ambulatory Visit: Payer: Self-pay

## 2021-03-13 VITALS — BP 121/83 | HR 62 | Temp 97.9°F | Ht 64.0 in | Wt 224.0 lb

## 2021-03-13 DIAGNOSIS — Z9189 Other specified personal risk factors, not elsewhere classified: Secondary | ICD-10-CM

## 2021-03-13 DIAGNOSIS — Z6841 Body Mass Index (BMI) 40.0 and over, adult: Secondary | ICD-10-CM

## 2021-03-13 DIAGNOSIS — F411 Generalized anxiety disorder: Secondary | ICD-10-CM

## 2021-03-13 DIAGNOSIS — E559 Vitamin D deficiency, unspecified: Secondary | ICD-10-CM

## 2021-03-13 DIAGNOSIS — I152 Hypertension secondary to endocrine disorders: Secondary | ICD-10-CM

## 2021-03-13 DIAGNOSIS — E1159 Type 2 diabetes mellitus with other circulatory complications: Secondary | ICD-10-CM | POA: Diagnosis not present

## 2021-03-13 MED ORDER — LOSARTAN POTASSIUM-HCTZ 50-12.5 MG PO TABS: 0.5000 | ORAL_TABLET | Freq: Every day | ORAL | 0 refills | Status: DC

## 2021-03-13 MED ORDER — VITAMIN D (ERGOCALCIFEROL) 1.25 MG (50000 UNIT) PO CAPS: 50000.0000 [IU] | ORAL_CAPSULE | ORAL | 0 refills | Status: DC

## 2021-03-13 MED ORDER — ESCITALOPRAM OXALATE 10 MG PO TABS: 10.0000 mg | ORAL_TABLET | Freq: Every day | ORAL | 0 refills | Status: DC

## 2021-03-21 NOTE — Progress Notes (Signed)
Chief Complaint:   OBESITY Anita Webb is here to discuss her progress with her obesity treatment plan along with follow-up of her obesity related diagnoses. Anita Webb is on the Category 4 Plan and states she is following her eating plan approximately 95% of the time. Anita Webb states she is walking 30 minutes 2 times per week.  Today's visit was #: 10 Starting weight: 308 lbs Starting date: 08/29/2020 Today's weight: 224 lbs Today's date: 03/13/2021 Total lbs lost to date: 84 Total lbs lost since last in-office visit: 11  Interim History: Anita Webb has been seeing William Hamburger, NP the past several office visits. Anita Webb denies hunger and cravings. She is eating all the foods on her plan and doesn't skip meals. Anita Webb has lost 84 pounds in the last 10 office visits.  Subjective:   1. Hypertension associated with type 2 diabetes mellitus (HCC) Cardiovascular ROS: Anita Webb's blood pressure is at goal.  BP Readings from Last 3 Encounters:  03/13/21 121/83  02/19/21 127/84  12/31/20 117/75   Lab Results  Component Value Date   CREATININE 0.76 02/19/2021   CREATININE 0.70 08/29/2020   CREATININE 0.84 05/22/2019   2. Vitamin D deficiency Low Vitamin D level contributes to fatigue and are associated with obesity, breast, and colon cancer. She agrees to continue to take prescription Vitamin D @50 ,000 IU every week and will follow-up for routine testing of Vitamin D, at least 2-3 times per year to avoid over-replacement.  3. GAD (generalized anxiety disorder) Anita Webb is still having some social anxiety. Anita Webb denies suicidal ideations, but admits to some depressed feelings from time to time.   4. At risk for depression Anita Webb is at risk of depression due to social anxiety.  Assessment/Plan:  No orders of the defined types were placed in this encounter.   Medications Discontinued During This Encounter  Medication Reason   escitalopram (LEXAPRO) 10 MG tablet Reorder   Vitamin D, Ergocalciferol, (DRISDOL) 1.25 MG  (50000 UNIT) CAPS capsule Reorder   losartan-hydrochlorothiazide (HYZAAR) 50-12.5 MG tablet Reorder     Meds ordered this encounter  Medications   Vitamin D, Ergocalciferol, (DRISDOL) 1.25 MG (50000 UNIT) CAPS capsule    Sig: Take 1 capsule (50,000 Units total) by mouth every 7 (seven) days.    Dispense:  4 capsule    Refill:  0   losartan-hydrochlorothiazide (HYZAAR) 50-12.5 MG tablet    Sig: Take 0.5 tablets by mouth daily.    Dispense:  45 tablet    Refill:  0   escitalopram (LEXAPRO) 10 MG tablet    Sig: Take 1 tablet (10 mg total) by mouth daily.    Dispense:  90 tablet    Refill:  0     1. Hypertension associated with type 2 diabetes mellitus (HCC) Anita Webb is working on healthy weight loss and exercise to improve blood pressure control. We will watch for signs of hypotension as she continues her lifestyle modifications.  - losartan-hydrochlorothiazide (HYZAAR) 50-12.5 MG tablet; Take 0.5 tablets by mouth daily.  Dispense: 45 tablet; Refill: 0  2. Vitamin D deficiency Low Vitamin D level contributes to fatigue and are associated with obesity, breast, and colon cancer. She agrees to continue to take prescription Vitamin D @50 ,000 IU every week and will follow-up for routine testing of Vitamin D, at least 2-3 times per year to avoid over-replacement.  - Vitamin D, Ergocalciferol, (DRISDOL) 1.25 MG (50000 UNIT) CAPS capsule; Take 1 capsule (50,000 Units total) by mouth every 7 (seven) days.  Dispense: 4  capsule; Refill: 0  3. GAD (generalized anxiety disorder) I will refill Lexapro at the same does, as Anita Webb declines the need for an increase. She agrees to use the Calm app for 10 minutes for anxiety.  - escitalopram (LEXAPRO) 10 MG tablet; Take 1 tablet (10 mg total) by mouth daily.  Dispense: 90 tablet; Refill: 0  4. At risk for depression Anita Webb was given approximately 10 minutes of depression risk counseling today. She has risk factors for depression including social anxiety. We  discussed the importance of a healthy work life balance, a healthy relationship with food and a good support system.  Repetitive spaced learning was employed today to elicit superior memory formation and behavioral change.   5. Obesity with current BMI of 38.5 I discussed loss goals and expectations for the future with Anita Webb.  Anita Webb is currently in the action stage of change. As such, her goal is to continue with weight loss efforts. She has agreed to the Category 4 Plan.   Exercise goals:  As is  Behavioral modification strategies: meal planning and cooking strategies and better snacking choices.  Anita Webb has agreed to follow-up with our clinic in 3 weeks. She was informed of the importance of frequent follow-up visits to maximize her success with intensive lifestyle modifications for her multiple health conditions.   Objective:   Blood pressure 121/83, pulse 62, temperature 97.9 F (36.6 C), height 5\' 4"  (1.626 m), weight 224 lb (101.6 kg), SpO2 99 %. Body mass index is 38.45 kg/m.  General: Cooperative, alert, well developed, in no acute distress. HEENT: Conjunctivae and lids unremarkable. Cardiovascular: Regular rhythm.  Lungs: Normal work of breathing. Neurologic: No focal deficits.   Lab Results  Component Value Date   CREATININE 0.76 02/19/2021   BUN 15 02/19/2021   NA 142 02/19/2021   K 4.5 02/19/2021   CL 102 02/19/2021   CO2 23 02/19/2021   Lab Results  Component Value Date   ALT 19 02/19/2021   AST 21 02/19/2021   ALKPHOS 65 02/19/2021   BILITOT 0.4 02/19/2021   Lab Results  Component Value Date   HGBA1C 5.5 02/19/2021   HGBA1C 6.8 (H) 08/29/2020   HGBA1C 5.7 05/22/2019   HGBA1C 5.7 08/29/2018   HGBA1C 6.1 06/24/2018   Lab Results  Component Value Date   INSULIN 7.1 02/19/2021   INSULIN 32.2 (H) 08/29/2020   Lab Results  Component Value Date   TSH 3.150 08/29/2020   Lab Results  Component Value Date   CHOL 167 02/19/2021   HDL 33 (L) 02/19/2021    LDLCALC 111 (H) 02/19/2021   LDLDIRECT 100.0 05/22/2019   TRIG 127 02/19/2021   CHOLHDL 5.1 (H) 02/19/2021   Lab Results  Component Value Date   VD25OH 47.3 02/19/2021   VD25OH 27.0 (L) 08/29/2020   VD25OH 31.65 05/22/2019   Lab Results  Component Value Date   WBC 6.9 08/29/2020   HGB 14.6 08/29/2020   HCT 42.6 08/29/2020   MCV 84 08/29/2020   PLT 195 08/29/2020   No results found for: IRON, TIBC, FERRITIN  Attestation Statements:   Reviewed by clinician on day of visit: allergies, medications, problem list, medical history, surgical history, family history, social history, and previous encounter notes.  I03/01/2021, CMA, am acting as transcriptionist for Kirke Corin, DO   I have reviewed the above documentation for accuracy and completeness, and I agree with the above. [employer], D.O.  The 21st Century Cures Act was signed  into law in 2016 which includes the topic of electronic health records.  This provides immediate access to information in MyChart.  This includes consultation notes, operative notes, office notes, lab results and pathology reports.  If you have any questions about what you read please let us know at your next visit so we can discuss your concerns and take corrective action if need be.  We are right here with you.

## 2021-04-03 ENCOUNTER — Encounter (INDEPENDENT_AMBULATORY_CARE_PROVIDER_SITE_OTHER): Payer: Self-pay | Admitting: Adult Health

## 2021-04-03 ENCOUNTER — Other Ambulatory Visit: Payer: Self-pay

## 2021-04-03 ENCOUNTER — Ambulatory Visit (INDEPENDENT_AMBULATORY_CARE_PROVIDER_SITE_OTHER): Payer: 59 | Admitting: Adult Health

## 2021-04-03 VITALS — BP 120/75 | HR 68 | Temp 98.5°F | Ht 64.0 in | Wt 222.0 lb

## 2021-04-03 DIAGNOSIS — E1159 Type 2 diabetes mellitus with other circulatory complications: Secondary | ICD-10-CM | POA: Diagnosis not present

## 2021-04-03 DIAGNOSIS — Z6841 Body Mass Index (BMI) 40.0 and over, adult: Secondary | ICD-10-CM

## 2021-04-03 DIAGNOSIS — F411 Generalized anxiety disorder: Secondary | ICD-10-CM | POA: Diagnosis not present

## 2021-04-03 DIAGNOSIS — E559 Vitamin D deficiency, unspecified: Secondary | ICD-10-CM

## 2021-04-03 DIAGNOSIS — Z9189 Other specified personal risk factors, not elsewhere classified: Secondary | ICD-10-CM

## 2021-04-03 DIAGNOSIS — I152 Hypertension secondary to endocrine disorders: Secondary | ICD-10-CM

## 2021-04-03 MED ORDER — VITAMIN D (ERGOCALCIFEROL) 1.25 MG (50000 UNIT) PO CAPS: 50000.0000 [IU] | ORAL_CAPSULE | ORAL | 0 refills | Status: DC

## 2021-04-03 MED ORDER — ESCITALOPRAM OXALATE 10 MG PO TABS: 10.0000 mg | ORAL_TABLET | Freq: Every day | ORAL | 0 refills | Status: DC

## 2021-04-03 MED ORDER — LOSARTAN POTASSIUM-HCTZ 50-12.5 MG PO TABS: 0.5000 | ORAL_TABLET | Freq: Every day | ORAL | 0 refills | Status: DC

## 2021-04-07 NOTE — Progress Notes (Signed)
Chief Complaint:   OBESITY Anita Webb is here to discuss her progress with her obesity treatment plan along with follow-up of her obesity related diagnoses. Anita Webb is on the Category 4 Plan and states she is following her eating plan approximately 90% of the time. Anita Webb states she is walking and hike for 30 minutes 2 times per week.  Today's visit was #: 11 Starting weight: 308 lbs Starting date: 08/29/2020 Today's weight: 222 lbs Today's date: 04/03/2021 Total lbs lost to date: 86 Total lbs lost since last in-office visit: 2  Interim History: Anita Webb traveled to [employer] [employer] with her in [employer], therapeutic to [place]. She was there for a week and had limited control of meals with extended family.  Subjective:   1. Hypertension associated with type 2 diabetes mellitus (HCC) Anita Webb's blood pressure and heart rate are excellent at her office visit today. She is on Hyzaar 50-12.5 mg 1/2 tablet q daily.  2. Vitamin D deficiency Anita Webb's last Vit D level was 47.3. She is on prescription Vit D, and she denies nausea, vomiting, or muscle weakness.  3. GAD (generalized anxiety disorder) Anita Webb is on Lexapro 10 mg q daily, and she reports stable mood. She denies suicidal or homicidal ideations. She was started on Lexapro in February 2022. She notes decreased in social anxiety and decreased in self-critical thoughts.  4. At risk for constipation Anita Webb is at risk for constipation due to consistent weight loss.  Assessment/Plan:   1. Hypertension associated with type 2 diabetes mellitus (HCC) Anita Webb will continue working on healthy weight loss and exercise to improve blood pressure control. We will watch for signs of hypotension as she continues her lifestyle modifications. We will refill Hyzaar for 1 month.  - losartan-hydrochlorothiazide (HYZAAR) 50-12.5 MG tablet; Take 0.5 tablets by mouth daily.  Dispense: 45 tablet; Refill: 0  2. Vitamin D deficiency Low Vitamin D level contributes to fatigue and are  associated with obesity, breast, and colon cancer. We will refill prescription Vitamin D for 1 month. Anita Webb will follow-up for routine testing of Vitamin D, at least 2-3 times per year to avoid over-replacement.  - Vitamin D, Ergocalciferol, (DRISDOL) 1.25 MG (50000 UNIT) CAPS capsule; Take 1 capsule (50,000 Units total) by mouth every 7 (seven) days.  Dispense: 4 capsule; Refill: 0  3. GAD (generalized anxiety disorder) Behavior modification techniques were discussed today to help Anita Webb deal with her anxiety. We will refill Lexapro for 90 days with no refills. Orders and follow up as documented in patient record.   - escitalopram (LEXAPRO) 10 MG tablet; Take 1 tablet (10 mg total) by mouth daily.  Dispense: 90 tablet; Refill: 0  4. At risk for constipation Anita Webb was given approximately 15 minutes of counseling today regarding prevention of constipation. She was encouraged to increase water and fiber intake.   5. Obesity with current BMI of 38.5 Anita Webb is currently in the action stage of change. As such, her goal is to continue with weight loss efforts. She has agreed to the Category 4 Plan.   Anita Webb is ok to enjoy a sweet potato 1-2 per week, just account for this as snack calories.  Exercise goals: As is.  Behavioral modification strategies: increasing lean protein intake, decreasing simple carbohydrates, meal planning and cooking strategies, keeping healthy foods in the home, and planning for success.  Anita Webb has agreed to follow-up with our clinic in 3 weeks. She was informed of the importance of frequent follow-up visits to maximize her success with  intensive lifestyle modifications for her multiple health conditions.   Objective:   Blood pressure 120/75, pulse 68, temperature 98.5 F (36.9 C), height 5\' 4"  (1.626 m), weight 222 lb (100.7 kg), SpO2 98 %. Body mass index is 38.11 kg/m.  General: Cooperative, alert, well developed, in no acute distress. HEENT: Conjunctivae and lids  unremarkable. Cardiovascular: Regular rhythm.  Lungs: Normal work of breathing. Neurologic: No focal deficits.   Lab Results  Component Value Date   CREATININE 0.76 02/19/2021   BUN 15 02/19/2021   NA 142 02/19/2021   K 4.5 02/19/2021   CL 102 02/19/2021   CO2 23 02/19/2021   Lab Results  Component Value Date   ALT 19 02/19/2021   AST 21 02/19/2021   ALKPHOS 65 02/19/2021   BILITOT 0.4 02/19/2021   Lab Results  Component Value Date   HGBA1C 5.5 02/19/2021   HGBA1C 6.8 (H) 08/29/2020   HGBA1C 5.7 05/22/2019   HGBA1C 5.7 08/29/2018   HGBA1C 6.1 06/24/2018   Lab Results  Component Value Date   INSULIN 7.1 02/19/2021   INSULIN 32.2 (H) 08/29/2020   Lab Results  Component Value Date   TSH 3.150 08/29/2020   Lab Results  Component Value Date   CHOL 167 02/19/2021   HDL 33 (L) 02/19/2021   LDLCALC 111 (H) 02/19/2021   LDLDIRECT 100.0 05/22/2019   TRIG 127 02/19/2021   CHOLHDL 5.1 (H) 02/19/2021   Lab Results  Component Value Date   VD25OH 47.3 02/19/2021   VD25OH 27.0 (L) 08/29/2020   VD25OH 31.65 05/22/2019   Lab Results  Component Value Date   WBC 6.9 08/29/2020   HGB 14.6 08/29/2020   HCT 42.6 08/29/2020   MCV 84 08/29/2020   PLT 195 08/29/2020   No results found for: IRON, TIBC, FERRITIN  Attestation Statements:   Reviewed by clinician on day of visit: allergies, medications, problem list, medical history, surgical history, family history, social history, and previous encounter notes.   10/27/2020, am acting as transcriptionist for Trude Mcburney, NP.  I have reviewed the above documentation for accuracy and completeness, and I agree with the above. -  Erandi Lemma d. Bentlee Benningfield, NP-C

## 2021-04-24 ENCOUNTER — Encounter (INDEPENDENT_AMBULATORY_CARE_PROVIDER_SITE_OTHER): Payer: Self-pay | Admitting: Adult Health

## 2021-04-24 ENCOUNTER — Ambulatory Visit (INDEPENDENT_AMBULATORY_CARE_PROVIDER_SITE_OTHER): Payer: 59 | Admitting: Adult Health

## 2021-04-24 ENCOUNTER — Other Ambulatory Visit: Payer: Self-pay

## 2021-04-24 VITALS — BP 126/73 | HR 69 | Temp 98.3°F | Ht 64.0 in | Wt 220.0 lb

## 2021-04-24 DIAGNOSIS — E119 Type 2 diabetes mellitus without complications: Secondary | ICD-10-CM

## 2021-04-24 DIAGNOSIS — E559 Vitamin D deficiency, unspecified: Secondary | ICD-10-CM | POA: Diagnosis not present

## 2021-04-24 DIAGNOSIS — Z9189 Other specified personal risk factors, not elsewhere classified: Secondary | ICD-10-CM | POA: Diagnosis not present

## 2021-04-24 DIAGNOSIS — F411 Generalized anxiety disorder: Secondary | ICD-10-CM | POA: Diagnosis not present

## 2021-04-24 DIAGNOSIS — Z6841 Body Mass Index (BMI) 40.0 and over, adult: Secondary | ICD-10-CM

## 2021-04-24 MED ORDER — VITAMIN D (ERGOCALCIFEROL) 1.25 MG (50000 UNIT) PO CAPS: 50000.0000 [IU] | ORAL_CAPSULE | ORAL | 0 refills | Status: DC

## 2021-04-24 NOTE — Progress Notes (Signed)
Chief Complaint:   OBESITY Anita Webb is here to discuss her progress with her obesity treatment plan along with follow-up of her obesity related diagnoses. Anita Webb is on the Category 4 Plan and states she is following her eating plan approximately 90% of the time. Anita Webb states she is walking/hiking for 30-60 minutes 2 times per week.  Today's visit was #: 12 Starting weight: 308 lbs Starting date: 08/29/2020 Today's weight: 220 lbs Today's date: 04/24/2021 Total lbs lost to date: 88 lbs Total lbs lost since last in-office visit: 2 lbs  Interim History: Anita Webb and her husband recently traveled to [place] for a long weekend.   She packed food/snacks to eat 2 meals per day on plan and then would enjoy one meal out. She was able to climb a mountain this trip, an endeavor she was unable to complete last trip to [place].  Subjective:   1. Vitamin D deficiency Vitamin D level on 02/19/2021, 47.3 - just below goal of 50. She is currently taking prescription ergocalciferol 50,000 IU each week. She denies nausea, vomiting or muscle weakness.  Lab Results  Component Value Date   VD25OH 47.3 02/19/2021   VD25OH 27.0 (L) 08/29/2020   VD25OH 31.65 05/22/2019   2. Type 2 diabetes mellitus without complication, without long-term current use of insulin (HCC) She stopped all antidiabetic agents >2 years ago.  Previously on metformin (tolerated), Trulicity (rash), then Rybelsus (tolerated). On 08/29/2020, A1c was 6.8.   On 02/19/2021, A1c was 5.5 - at goal without any antidiabetic agents.  Lab Results  Component Value Date   HGBA1C 5.5 02/19/2021   HGBA1C 6.8 (H) 08/29/2020   HGBA1C 5.7 05/22/2019   Lab Results  Component Value Date   LDLCALC 111 (H) 02/19/2021   CREATININE 0.76 02/19/2021   Lab Results  Component Value Date   INSULIN 7.1 02/19/2021   INSULIN 32.2 (H) 08/29/2020   3. GAD (generalized anxiety disorder) She is on escitalopram 10 mg daily - started in early 2022. She reports  excellent control of anxiety symptoms.  Denies SI/HI.  4. At risk for osteoporosis Anita Webb is at higher risk of osteopenia and osteoporosis due to Vitamin D deficiency.    Assessment/Plan:   1. Vitamin D deficiency Low Vitamin D level contributes to fatigue and are associated with obesity, breast, and colon cancer. She agrees to continue to take prescription ergocalciferol @50 ,000 IU every week. Check labs in early October.  - Refill Vitamin D, Ergocalciferol, (DRISDOL) 1.25 MG (50000 UNIT) CAPS capsule; Take 1 capsule (50,000 Units total) by mouth every 7 (seven) days.  Dispense: 4 capsule; Refill: 0  2. Type 2 diabetes mellitus without complication, without long-term current use of insulin (HCC) Check labs in early October.  3. GAD (generalized anxiety disorder) Continue escitalopram 10 mg daily.  No need for refill.  4. At risk for osteoporosis Saramarie was given approximately 15 minutes of osteoporosis prevention counseling today. Anita Webb is at risk for osteopenia and osteoporosis due to her Vitamin D deficiency. She was encouraged to take her Vitamin D and follow her higher calcium diet and increase strengthening exercise to help strengthen her bones and decrease her risk of osteopenia and osteoporosis.  Repetitive spaced learning was employed today to elicit superior memory formation and behavioral change.   5. Obesity with current BMI of 37.8  Anita Webb is currently in the action stage of change. As such, her goal is to continue with weight loss efforts. She has agreed to the Category 4  Plan.   Exercise goals:  As is.  Behavioral modification strategies: increasing lean protein intake, decreasing simple carbohydrates, meal planning and cooking strategies, keeping healthy foods in the home, travel eating strategies, and planning for success.  Handout:  High Protein/Low Cal Food List, Protein Content of Food  Check fasting labs in early October.  Anita Webb has agreed to follow-up with our clinic  in 3 weeks. She was informed of the importance of frequent follow-up visits to maximize her success with intensive lifestyle modifications for her multiple health conditions.   Objective:   Blood pressure 126/73, pulse 69, temperature 98.3 F (36.8 C), height 5\' 4"  (1.626 m), weight 220 lb (99.8 kg), SpO2 97 %. Body mass index is 37.76 kg/m.  General: Cooperative, alert, well developed, in no acute distress. HEENT: Conjunctivae and lids unremarkable. Cardiovascular: Regular rhythm.  Lungs: Normal work of breathing. Neurologic: No focal deficits.   Lab Results  Component Value Date   CREATININE 0.76 02/19/2021   BUN 15 02/19/2021   NA 142 02/19/2021   K 4.5 02/19/2021   CL 102 02/19/2021   CO2 23 02/19/2021   Lab Results  Component Value Date   ALT 19 02/19/2021   AST 21 02/19/2021   ALKPHOS 65 02/19/2021   BILITOT 0.4 02/19/2021   Lab Results  Component Value Date   HGBA1C 5.5 02/19/2021   HGBA1C 6.8 (H) 08/29/2020   HGBA1C 5.7 05/22/2019   HGBA1C 5.7 08/29/2018   HGBA1C 6.1 06/24/2018   Lab Results  Component Value Date   INSULIN 7.1 02/19/2021   INSULIN 32.2 (H) 08/29/2020   Lab Results  Component Value Date   TSH 3.150 08/29/2020   Lab Results  Component Value Date   CHOL 167 02/19/2021   HDL 33 (L) 02/19/2021   LDLCALC 111 (H) 02/19/2021   LDLDIRECT 100.0 05/22/2019   TRIG 127 02/19/2021   CHOLHDL 5.1 (H) 02/19/2021   Lab Results  Component Value Date   VD25OH 47.3 02/19/2021   VD25OH 27.0 (L) 08/29/2020   VD25OH 31.65 05/22/2019   Lab Results  Component Value Date   WBC 6.9 08/29/2020   HGB 14.6 08/29/2020   HCT 42.6 08/29/2020   MCV 84 08/29/2020   PLT 195 08/29/2020   Attestation Statements:   Reviewed by clinician on day of visit: allergies, medications, problem list, medical history, surgical history, family history, social history, and previous encounter notes.  I, 10/27/2020, CMA, am acting as Insurance claims handler for Energy manager,  NP.  I have reviewed the above documentation for accuracy and completeness, and I agree with the above. -Alanmichael Barmore d. Shakeila Pfarr, NP-C

## 2021-05-15 ENCOUNTER — Other Ambulatory Visit: Payer: Self-pay

## 2021-05-15 ENCOUNTER — Encounter (INDEPENDENT_AMBULATORY_CARE_PROVIDER_SITE_OTHER): Payer: Self-pay | Admitting: Adult Health

## 2021-05-15 ENCOUNTER — Ambulatory Visit (INDEPENDENT_AMBULATORY_CARE_PROVIDER_SITE_OTHER): Payer: 59 | Admitting: Adult Health

## 2021-05-15 VITALS — BP 127/80 | HR 65 | Temp 98.0°F | Ht 64.0 in | Wt 214.0 lb

## 2021-05-15 DIAGNOSIS — Z6841 Body Mass Index (BMI) 40.0 and over, adult: Secondary | ICD-10-CM

## 2021-05-15 DIAGNOSIS — E1169 Type 2 diabetes mellitus with other specified complication: Secondary | ICD-10-CM | POA: Diagnosis not present

## 2021-05-15 DIAGNOSIS — E785 Hyperlipidemia, unspecified: Secondary | ICD-10-CM | POA: Diagnosis not present

## 2021-05-15 DIAGNOSIS — E559 Vitamin D deficiency, unspecified: Secondary | ICD-10-CM | POA: Diagnosis not present

## 2021-05-15 DIAGNOSIS — Z9189 Other specified personal risk factors, not elsewhere classified: Secondary | ICD-10-CM

## 2021-05-15 MED ORDER — VITAMIN D (ERGOCALCIFEROL) 1.25 MG (50000 UNIT) PO CAPS: 50000.0000 [IU] | ORAL_CAPSULE | ORAL | 0 refills | Status: DC

## 2021-05-15 NOTE — Progress Notes (Signed)
Chief Complaint:   OBESITY Anita Webb is here to discuss her progress with her obesity treatment plan along with follow-up of her obesity related diagnoses. Anita Webb is on the Category 4 Plan and states she is following her eating plan approximately 90% of the time. Anita Webb states she is walking for 30 minutes 2 times per week.  Today's visit was #: 13 Starting weight: 308 lbs Starting date: 08/29/2020 Today's weight: 214 lbs Today's date: 05/15/2021 Total lbs lost to date: 94 lbs Total lbs lost since last in-office visit: 6 lbs  Interim History: Anita Webb continues to enjoy the foods/structure of the Category 4 meal plan - down a total of 94 pounds!! Reviewed [employer] results with pt at length.  Subjective:   1. Vitamin D deficiency Vitamin D level on 02/19/2021 - 47.3 - just below goal of 50. She is currently taking prescription vitamin D 50,000 IU each week. She denies nausea, vomiting or muscle weakness.  Lab Results  Component Value Date   VD25OH 47.3 02/19/2021   VD25OH 27.0 (L) 08/29/2020   VD25OH 31.65 05/22/2019   2. Hyperlipidemia associated with type 2 diabetes mellitus (HCC) She denies tobacco/vape use. Father has HLD. She is not on statin therapy.  Lab Results  Component Value Date   ALT 19 02/19/2021   AST 21 02/19/2021   ALKPHOS 65 02/19/2021   BILITOT 0.4 02/19/2021   Lab Results  Component Value Date   CHOL 167 02/19/2021   HDL 33 (L) 02/19/2021   LDLCALC 111 (H) 02/19/2021   LDLDIRECT 100.0 05/22/2019   TRIG 127 02/19/2021   CHOLHDL 5.1 (H) 02/19/2021   3. At risk for osteoporosis Arasely is at higher risk of osteopenia and osteoporosis due to Vitamin D deficiency and obesity.    Assessment/Plan:   1. Vitamin D deficiency Refill ergocalciferol 50,000 IU once weekly, as per below.  - Refill Vitamin D, Ergocalciferol, (DRISDOL) 1.25 MG (50000 UNIT) CAPS capsule; Take 1 capsule (50,000 Units total) by mouth every 7 (seven) days.  Dispense: 4 capsule; Refill:  0  2. Hyperlipidemia associated with type 2 diabetes mellitus (HCC) Check labs at next office visit.  3. At risk for osteoporosis Anita Webb was given approximately 15 minutes of osteoporosis prevention counseling today. Anita Webb is at risk for osteopenia and osteoporosis due to her Vitamin D deficiency. She was encouraged to take her Vitamin D and follow her higher calcium diet and increase strengthening exercise to help strengthen her bones and decrease her risk of osteopenia and osteoporosis.  Repetitive spaced learning was employed today to elicit superior memory formation and behavioral change.   4. Obesity with current BMI of 36.8  Anita Webb is currently in the action stage of change. As such, her goal is to continue with weight loss efforts. She has agreed to the Category 4 Plan.   Exercise goals:  As is.  Behavioral modification strategies: increasing lean protein intake, decreasing simple carbohydrates, meal planning and cooking strategies, keeping healthy foods in the home, and planning for success.  Check fasting labs at next visit.  Anita Webb has agreed to follow-up with our clinic in 4 weeks, fasting. She was informed of the importance of frequent follow-up visits to maximize her success with intensive lifestyle modifications for her multiple health conditions.   Objective:   Blood pressure 127/80, pulse 65, temperature 98 F (36.7 C), height 5\' 4"  (1.626 m), weight 214 lb (97.1 kg), SpO2 98 %. Body mass index is 36.73 kg/m.  General: Cooperative, alert, well developed,  in no acute distress. HEENT: Conjunctivae and lids unremarkable. Cardiovascular: Regular rhythm.  Lungs: Normal work of breathing. Neurologic: No focal deficits.   Lab Results  Component Value Date   CREATININE 0.76 02/19/2021   BUN 15 02/19/2021   NA 142 02/19/2021   K 4.5 02/19/2021   CL 102 02/19/2021   CO2 23 02/19/2021   Lab Results  Component Value Date   ALT 19 02/19/2021   AST 21 02/19/2021   ALKPHOS  65 02/19/2021   BILITOT 0.4 02/19/2021   Lab Results  Component Value Date   HGBA1C 5.5 02/19/2021   HGBA1C 6.8 (H) 08/29/2020   HGBA1C 5.7 05/22/2019   HGBA1C 5.7 08/29/2018   HGBA1C 6.1 06/24/2018   Lab Results  Component Value Date   INSULIN 7.1 02/19/2021   INSULIN 32.2 (H) 08/29/2020   Lab Results  Component Value Date   TSH 3.150 08/29/2020   Lab Results  Component Value Date   CHOL 167 02/19/2021   HDL 33 (L) 02/19/2021   LDLCALC 111 (H) 02/19/2021   LDLDIRECT 100.0 05/22/2019   TRIG 127 02/19/2021   CHOLHDL 5.1 (H) 02/19/2021   Lab Results  Component Value Date   VD25OH 47.3 02/19/2021   VD25OH 27.0 (L) 08/29/2020   VD25OH 31.65 05/22/2019   Lab Results  Component Value Date   WBC 6.9 08/29/2020   HGB 14.6 08/29/2020   HCT 42.6 08/29/2020   MCV 84 08/29/2020   PLT 195 08/29/2020   Attestation Statements:   Reviewed by clinician on day of visit: allergies, medications, problem list, medical history, surgical history, family history, social history, and previous encounter notes.  I, Insurance claims handler, CMA, am acting as Energy manager for William Hamburger, NP.  I have reviewed the above documentation for accuracy and completeness, and I agree with the above. -  Shellee Streng d. Imran Nuon, NP-C

## 2021-05-19 ENCOUNTER — Encounter (INDEPENDENT_AMBULATORY_CARE_PROVIDER_SITE_OTHER): Payer: Self-pay | Admitting: Adult Health

## 2021-05-20 NOTE — Telephone Encounter (Signed)
Please advise 

## 2021-06-19 ENCOUNTER — Other Ambulatory Visit: Payer: Self-pay

## 2021-06-19 ENCOUNTER — Encounter (INDEPENDENT_AMBULATORY_CARE_PROVIDER_SITE_OTHER): Payer: Self-pay | Admitting: Adult Health

## 2021-06-19 ENCOUNTER — Ambulatory Visit (INDEPENDENT_AMBULATORY_CARE_PROVIDER_SITE_OTHER): Payer: 59 | Admitting: Adult Health

## 2021-06-19 VITALS — BP 138/84 | HR 63 | Temp 97.6°F | Ht 64.0 in | Wt 209.0 lb

## 2021-06-19 DIAGNOSIS — Z6841 Body Mass Index (BMI) 40.0 and over, adult: Secondary | ICD-10-CM

## 2021-06-19 DIAGNOSIS — Z9189 Other specified personal risk factors, not elsewhere classified: Secondary | ICD-10-CM | POA: Diagnosis not present

## 2021-06-19 DIAGNOSIS — E1169 Type 2 diabetes mellitus with other specified complication: Secondary | ICD-10-CM | POA: Diagnosis not present

## 2021-06-19 DIAGNOSIS — E119 Type 2 diabetes mellitus without complications: Secondary | ICD-10-CM | POA: Diagnosis not present

## 2021-06-19 DIAGNOSIS — E559 Vitamin D deficiency, unspecified: Secondary | ICD-10-CM | POA: Diagnosis not present

## 2021-06-19 DIAGNOSIS — E785 Hyperlipidemia, unspecified: Secondary | ICD-10-CM

## 2021-06-19 DIAGNOSIS — F419 Anxiety disorder, unspecified: Secondary | ICD-10-CM | POA: Diagnosis not present

## 2021-06-19 NOTE — Progress Notes (Signed)
Chief Complaint:   OBESITY Anita Webb is here to discuss her progress with her obesity treatment plan along with follow-up of her obesity related diagnoses. Anita Webb is on the Category 4 Plan and states she is following her eating plan approximately 90% of the time. Anita Webb states she is walking for 30 minutes 2 times per week.  Today's visit was #: 14 Starting weight: 308 lbs Starting date: 08/29/2020 Today's weight: 209 lbs Today's date: 06/19/2021 Total lbs lost to date: 99 lbs Total lbs lost since last in-office visit: 5 lbs  Interim History: Anita Webb has lost 99 pounds!! She states, "I'm always worried I will slip and the weight will come back".  Reviewed bioimpedance with patient.  Subjective:   1. Type 2 diabetes mellitus without complication, without long-term current use of insulin (HCC) On 08/29/2020, A1c 68.  On 02/14/2021, A1c 5.5 - at goal. She stopped all antidiabetic agents >2 years ago.  Previously on metformin (tolerated), Trulicity (rash), then Rybelsus (tolerated). On 08/29/2020, A1c was 6.8.   On 02/19/2021, A1c was 5.5 - at goal without any antidiabetic agents.  2. Hyperlipidemia associated with type 2 diabetes mellitus (HCC) On 02/19/2021, lipid panel - low HDL, high LDL. Not on statin therapy. She denies tobacco/vape use.  Family history of hyperlipidemia - mother/father.  3. Vitamin D deficiency On 02/19/2021, vitamin D level - 47.3.  4. Anxiety She reports frequent worry and/or anxiety about gaining her lost weight back.  When she consumes anything "off plan", she immediately worries that weight gain is inevitable. She is agreeable to referral to Dr. Dewaine Conger.  5. At risk for hyperglycemia Aarini is at risk for hyperglycemia due to T2D, not on any antidiabetic medicaiton.  Assessment/Plan:   1. Type 2 diabetes mellitus without complication, without long-term current use of insulin (HCC) Check labs today.  - Comprehensive metabolic panel - Hemoglobin A1c - Insulin,  random  2. Hyperlipidemia associated with type 2 diabetes mellitus (HCC) Will check labs today.  - Lipid panel  3. Vitamin D deficiency Check vitamin D level today.  - VITAMIN D 25 Hydroxy (Vit-D Deficiency, Fractures)  4. Anxiety Referral to Dr. Dewaine Conger.  5. At risk for hyperglycemia Anita Webb was given approximately 15 minutes of counseling today regarding prevention of hyperglycemia. She was advised of hyperglycemia causes and the fact hyperglycemia is often asymptomatic. Anita Webb was instructed to avoid skipping meals, eat regular protein rich meals and schedule low calorie but protein rich snacks as needed.   Repetitive spaced learning was employed today to elicit superior memory formation and behavioral change   6. Obesity with current BMI of 35.9  Jarika is currently in the action stage of change. As such, her goal is to continue with weight loss efforts. She has agreed to the Category 4 Plan.   Exercise goals:  As is.  Behavioral modification strategies: increasing lean protein intake, meal planning and cooking strategies, keeping healthy foods in the home, and planning for success.  Anita Webb has agreed to follow-up with our clinic in 4 weeks. She was informed of the importance of frequent follow-up visits to maximize her success with intensive lifestyle modifications for her multiple health conditions.   Anita Webb was informed we would discuss her lab results at her next visit unless there is a critical issue that needs to be addressed sooner. Anita Webb agreed to keep her next visit at the agreed upon time to discuss these results.  Objective:   Blood pressure 138/84, pulse 63, temperature 97.6 F (36.4  C), height 5\' 4"  (1.626 m), weight 209 lb (94.8 kg), SpO2 99 %. Body mass index is 35.87 kg/m.  General: Cooperative, alert, well developed, in no acute distress. HEENT: Conjunctivae and lids unremarkable. Cardiovascular: Regular rhythm.  Lungs: Normal work of breathing. Neurologic: No focal  deficits.   Lab Results  Component Value Date   CREATININE 0.76 02/19/2021   BUN 15 02/19/2021   NA 142 02/19/2021   K 4.5 02/19/2021   CL 102 02/19/2021   CO2 23 02/19/2021   Lab Results  Component Value Date   ALT 19 02/19/2021   AST 21 02/19/2021   ALKPHOS 65 02/19/2021   BILITOT 0.4 02/19/2021   Lab Results  Component Value Date   HGBA1C 5.5 02/19/2021   HGBA1C 6.8 (H) 08/29/2020   HGBA1C 5.7 05/22/2019   HGBA1C 5.7 08/29/2018   HGBA1C 6.1 06/24/2018   Lab Results  Component Value Date   INSULIN 7.1 02/19/2021   INSULIN 32.2 (H) 08/29/2020   Lab Results  Component Value Date   TSH 3.150 08/29/2020   Lab Results  Component Value Date   CHOL 167 02/19/2021   HDL 33 (L) 02/19/2021   LDLCALC 111 (H) 02/19/2021   LDLDIRECT 100.0 05/22/2019   TRIG 127 02/19/2021   CHOLHDL 5.1 (H) 02/19/2021   Lab Results  Component Value Date   VD25OH 47.3 02/19/2021   VD25OH 27.0 (L) 08/29/2020   VD25OH 31.65 05/22/2019   Lab Results  Component Value Date   WBC 6.9 08/29/2020   HGB 14.6 08/29/2020   HCT 42.6 08/29/2020   MCV 84 08/29/2020   PLT 195 08/29/2020   Attestation Statements:   Reviewed by clinician on day of visit: allergies, medications, problem list, medical history, surgical history, family history, social history, and previous encounter notes.  I, 10/27/2020, CMA, am acting as Insurance claims handler for Energy manager, NP.  I have reviewed the above documentation for accuracy and completeness, and I agree with the above. -  Freya Zobrist d. Franchot Pollitt, NP-C

## 2021-06-20 LAB — COMPREHENSIVE METABOLIC PANEL
ALT: 17 IU/L (ref 0–32)
AST: 21 IU/L (ref 0–40)
Albumin/Globulin Ratio: 1.7 (ref 1.2–2.2)
Albumin: 4.8 g/dL (ref 3.8–4.8)
Alkaline Phosphatase: 62 IU/L (ref 44–121)
BUN/Creatinine Ratio: 25 — ABNORMAL HIGH (ref 9–23)
BUN: 20 mg/dL (ref 6–24)
Bilirubin Total: 0.5 mg/dL (ref 0.0–1.2)
CO2: 24 mmol/L (ref 20–29)
Calcium: 9.5 mg/dL (ref 8.7–10.2)
Chloride: 102 mmol/L (ref 96–106)
Creatinine, Ser: 0.81 mg/dL (ref 0.57–1.00)
Globulin, Total: 2.8 g/dL (ref 1.5–4.5)
Glucose: 83 mg/dL (ref 70–99)
Potassium: 4.7 mmol/L (ref 3.5–5.2)
Sodium: 143 mmol/L (ref 134–144)
Total Protein: 7.6 g/dL (ref 6.0–8.5)
eGFR: 93 mL/min/{1.73_m2} (ref >59)

## 2021-06-20 LAB — HEMOGLOBIN A1C
Est. average glucose Bld gHb Est-mCnc: 108 mg/dL
Hgb A1c MFr Bld: 5.4 % (ref 4.8–5.6)

## 2021-06-20 LAB — INSULIN, RANDOM: INSULIN: 7.1 u[IU]/mL (ref 2.6–24.9)

## 2021-06-20 LAB — LIPID PANEL
Chol/HDL Ratio: 4.9 ratio — ABNORMAL HIGH (ref 0.0–4.4)
Cholesterol, Total: 168 mg/dL (ref 100–199)
HDL: 34 mg/dL — ABNORMAL LOW (ref >39)
LDL Chol Calc (NIH): 113 mg/dL — ABNORMAL HIGH (ref 0–99)
Triglycerides: 116 mg/dL (ref 0–149)
VLDL Cholesterol Cal: 21 mg/dL (ref 5–40)

## 2021-06-20 LAB — VITAMIN D 25 HYDROXY (VIT D DEFICIENCY, FRACTURES): Vit D, 25-Hydroxy: 56.9 ng/mL (ref 30.0–100.0)

## 2021-06-22 NOTE — Progress Notes (Signed)
Office: [phone]  /  Fax: [phone]    Date: June 23, 2021    Appointment Start Time: 3:00pm Duration: 52 minutes Provider: Glennie Webb, Psy.D. Type of Session: Intake for Individual Therapy  Location of Patient: Home (private location) Location of Provider: Provider's home (private office) Type of Contact: Telepsychological Visit via MyChart Video Visit  Informed Consent: Prior to proceeding with today's appointment, two pieces of identifying information were obtained. In addition, Anita Webb's physical location at the time of this appointment was obtained as well a phone number she could be reached at in the event of technical difficulties. Anita Webb and this provider participated in today's telepsychological service.   The provider's role was explained to Anita Webb. The provider reviewed and discussed issues of confidentiality, privacy, and limits therein (e.g., reporting obligations). In addition to verbal informed consent, written informed consent for psychological services was obtained prior to the initial appointment. Since the clinic is not a 24/7 crisis center, mental health emergency resources were shared and this  provider explained MyChart, e-mail, voicemail, and/or other messaging systems should be utilized only for non-emergency reasons. This provider also explained that information obtained during appointments will be placed in Anita Webb's medical record and relevant information will be shared with other providers at [facility] for coordination of care. Anita Webb agreed information Anita Webb be shared with other [facility] providers as needed for coordination of care and by signing the service agreement document, she provided written consent for coordination of care. Prior to initiating telepsychological services, Anita Webb completed an informed consent document, which included the development of a safety plan (i.e., an emergency contact and emergency resources) in the  event of an emergency/crisis. Anita Webb verbally acknowledged understanding she is ultimately responsible for understanding her insurance benefits for telepsychological and in-person services. This provider also reviewed confidentiality, as it relates to telepsychological services, as well as the rationale for telepsychological services (i.e., to reduce exposure risk to COVID-19). Anita Webb  acknowledged understanding that appointments cannot be recorded without both party consent and she is aware she is responsible for securing confidentiality on her end of the session. Anita Webb verbally consented to proceed.  Chief Complaint/HPI: Anita Webb was referred by Anita Marble, NP-C due to anxiety. Per the note for the visit with Anita Marble, NP-C on June 19, 2021, "She reports frequent worry and/or anxiety about gaining her lost weight back.  When she consumes anything "off plan", she immediately worries that weight gain is inevitable. She is agreeable to referral to Anita Webb."   During today's appointment, Anita Webb reported she has "not felt excited about [her] progress." She described experiencing anticipatory anxiety about when she will re-gain the weight she has lost. Anita Webb acknowledged a history of dieting over the years. Additionally, she discussed a history of engagement in emotional eating behaviors, adding she will use her snack calories for "comfort" eating in the evenings due to stress. She described the frequency of the aforementioned as "several times a week." She denied engagement in binge eating behaviors since starting with the program. Anita Webb disclosed a history of restricting food intake in high school. She denied a history purging and engagement in other compensatory strategies for weight loss, and has never been diagnosed with an eating disorder. She also denied a history of treatment for emotional eating. Currently, Anita Webb indicated having time for herself makes emotional eating behaviors better. Furthermore, Anita Webb  disclosed witnessing her mother struggling with weight loss.   Mental Status Examination:  Appearance: well groomed and appropriate  hygiene  Behavior: appropriate to circumstances Mood: anxious Affect: mood congruent Speech: normal in rate, volume, and tone Eye Contact: appropriate Psychomotor Activity: appropriate  Gait: unable to assess  Thought Process: linear, logical, and goal directed  Thought Content/Perception: denies suicidal and homicidal ideation, plan, and intent, no hallucinations, delusions, bizarre thinking or behavior reported or observed, and denies ideation and engagement in self-injurious behaviors Orientation: time, person, place, and purpose of appointment Memory/Concentration: memory, attention, language, and fund of knowledge intact  Insight/Judgment: good  Family & Psychosocial History: Anita Webb reported she is married and she has three children (ages 44, 42, and 72). 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 Additionally, Anita Webb shared her highest level of education obtained is a master's degree. Currently, Anita Webb's social support system consists of her husband, father, in laws, and church family Moreover, Anita Webb stated she resides with her husband and children. Furthermore, she indicated her mother has early onset dementia, noting she helps as a caregiver.  Medical History:  Past Medical History:  Diagnosis Date   B12 deficiency    Depression    Diabetes mellitus without complication ([name])    Phreesia 09/30/2020   Fatty liver    Hypertension    Joint pain    PCOS (polycystic ovarian syndrome)    Vitamin D deficiency    Past Surgical History:  Procedure Laterality Date   CESAREAN SECTION  02/2007   FOOT SURGERY  2000   bone removed from left heel    Current Outpatient Medications on File Prior to Visit  Medication Sig Dispense Refill   B Complex Vitamins (VITAMIN-B COMPLEX PO) Take by mouth.     Blood Glucose Monitoring Suppl (TRUE METRIX METER)  w/Device KIT Use as directed 1 kit 0   escitalopram (LEXAPRO) 10 MG tablet Take 1 tablet (10 mg total) by mouth daily. 90 tablet 0   glucose blood (TRUE METRIX BLOOD GLUCOSE TEST) test strip Use as instructed 100 each 12   levonorgestrel (LILETTA) 19.5 MCG/DAY IUD IUD 1 each by Intrauterine route once.     losartan-hydrochlorothiazide (HYZAAR) 50-12.5 MG tablet Take 0.5 tablets by mouth daily. 45 tablet 0   TRUEplus Lancets 28G MISC Use as directed 100 each 4   Vitamin D, Ergocalciferol, (DRISDOL) 1.25 MG (50000 UNIT) CAPS capsule Take 1 capsule (50,000 Units total) by mouth every 7 (seven) days. 4 capsule 0   No current facility-administered medications on file prior to visit.  Medication compliant.   Mental Health History: Wilhemenia reported she has never attended therapeutic services. She reported Dr. Juleen Webb prescribed Lexapro due to anxiety, adding it has "helped a whole lot." Laurene reported there is no history of hospitalizations for psychiatric concerns. Anita Webb denied a diagnosed family history of mental health/substance abuse related concerns. She believes her mother had a history of depression and anxiety and her father likely meets criteria for alcohol abuse. Anita Webb reported there is no history of trauma including psychological, physical , and sexual abuse, as well as neglect.   Anita Webb reported a history of suicidal ideation (e.g., "Wherever I was, the space would be better off without me.") starting in puberty, noting the last time was likely 8 months ago before she started Lexapro. She denied experiencing suicidal plan and intent; however, discussed having "visions of slit wrists." She denied a history of suicide attempts or initiation of an attempt. She believes the suicidal ideation was "hormone related." Notably, Anita Webb endorsed item 9 (i.e., "Do you feel that your weight problem is so hopeless that  sometimes life doesn't seem worth living?") on the modified PHQ-9 during her initial appointment with  Dr. Mellody Dance on August 29, 2020. She indicated she endorsed the item due to feeling hopeless about her weight at the time, noting when she experienced suicidal ideation it was not related to her weight. The following protective factors were identified for Anita Webb: children, husband, church community, father, and faith. If she were to become overwhelmed in the future, which is a sign that a crisis Anita Webb occur, she identified the following coping skills she could engage in: write, listen to music, read, go for a walk, pray, read bible, and talk with friend. It was recommended the aforementioned be written down and developed into a coping card for future reference. Psychoeducation regarding the importance of reaching out to a trusted individual and/or utilizing emergency resources if there is a change in emotional status and/or there is an inability to ensure safety was provided. Anita Webb's confidence in reaching out to a trusted individual and/or utilizing emergency resources should there be an intensification in emotional status and/or there is an inability to ensure safety was assessed on a scale of one to ten where one is not confident and ten is extremely confident. She reported her confidence is a 9, noting, "I don't like to bother people." However, she discussed feeling comfortable and expressed confidence in her ability to reach out to her providers with the clinic. Additionally, Diasha denied current access to firearms and/or weapons.   Weslie described her typical mood lately as "even keel, pretty calm." She added, "Anxiety is kind of background noise." Aside from concerns noted above and endorsed on the PHQ-9 and GAD-7, Makeshia reported experiencing worry thoughts about her parents, grandparents' and children's well-being and self-deprecating thoughts. Genavieve denied current alcohol use. She denied tobacco use. She denied illicit/recreational substance use; however, she indicated she takes a "legal" CBD gummy.  Regarding caffeine intake, Tattiana reported consuming 16oz-30oz of coffee daily. Furthermore, Ryleah indicated she is not experiencing the following: hallucinations and delusions, paranoia, symptoms of mania , social withdrawal, crying spells, panic attacks, memory concerns, attention and concentration issues, and obsessions and compulsions. She also denied current suicidal ideation, plan, and intent; history of and current homicidal ideation, plan, and intent; and history of and current engagement in self-harm.   The following strengths were reported by Barnett Applebaum: good friend and caregiver, caring, helpful, patient, and not quick to anger. The following strengths were observed by this provider: ability to express thoughts and feelings during the therapeutic session, ability to establish and benefit from a therapeutic relationship, willingness to work toward established goal(s) with the clinic and ability to engage in reciprocal conversation.   Legal History: Gay reported there is no history of legal involvement.   Structured Assessments Results: The Patient Health Questionnaire-9 (PHQ-9) is a self-report measure that assesses symptoms and severity of depression over the course of the last two weeks. Hayzlee obtained a score of 6 suggesting mild depression. Sherryn finds the endorsed symptoms to be not difficult at all. [0= Not at all; 1= Several days; 2= More than half the days; 3= Nearly every day] Little interest or pleasure in doing things 0  Feeling down, depressed, or hopeless 1  Trouble falling or staying asleep, or sleeping too much 3  Feeling tired or having little energy 1  Poor appetite or overeating 0  Feeling bad about yourself --- or that you are a failure or have let yourself or your family down 1  Trouble concentrating  on things, such as reading the newspaper or watching television 0  Moving or speaking so slowly that other people could have noticed? Or the opposite --- being so fidgety or restless  that you have been moving around a lot more than usual 0  Thoughts that you would be better off dead or hurting yourself in some way 0  PHQ-9 Score 6    The Generalized Anxiety Disorder-7 (GAD-7) is a brief self-report measure that assesses symptoms of anxiety over the course of the last two weeks. Vesper obtained a score of 10. suggesting moderate anxiety. Belissa finds the endorsed symptoms to be somewhat difficult. [0= Not at all; 1= Several days; 2= Over half the days; 3= Nearly every day] Feeling nervous, anxious, on edge 2  Not being able to stop or control worrying 2  Worrying too much about different things 2  Trouble relaxing 3  Being so restless that it's hard to sit still 0  Becoming easily annoyed or irritable 0  Feeling afraid as if something awful might happen- regarding her mother 1  GAD-7 Score 10   Interventions:  Conducted a chart review Focused on rapport building Verbally administered PHQ-9 and GAD-7 for symptom monitoring Provided emphatic reflections and validation Collaborated with patient on a treatment goal  Conducted a risk assessment Developed a coping card Recommended/discussed option for longer-term therapeutic services Psychoeducation provided regarding self-compassion  Engaged patient in self-compassion exercise  Provisional DSM-5 Diagnosis(es): F50.89 Other Specified Feeding or Eating Disorder, Emotional Eating Behaviors, F41.1 Generalized Anxiety Disorder, and F32.A Unspecified Depressive Disorder  Plan: Zamiah appears able and willing to participate as evidenced by collaboration on a treatment goal, engagement in reciprocal conversation, and asking questions as needed for clarification. The next appointment will be scheduled in approximately two weeks, which will be via MyChart Video Visit. The following treatment goal was established: increase coping skills. This provider will regularly review the treatment plan and medical chart to keep informed of status  changes. She indicated a plan of explored therapist options after her course of treatment with this provider. Glenis expressed understanding and agreement with the initial treatment plan of care.   Psychoeducation provided regarding self-compassion. Paulett was engaged in a self-compassion exercise to help with eating-related challenges and other ongoing stressors. She was encouraged to regularly ask, "What do I need right now?"

## 2021-06-23 ENCOUNTER — Telehealth (INDEPENDENT_AMBULATORY_CARE_PROVIDER_SITE_OTHER): Payer: 59 | Admitting: Psychology

## 2021-06-23 DIAGNOSIS — F411 Generalized anxiety disorder: Secondary | ICD-10-CM

## 2021-06-23 DIAGNOSIS — F5089 Other specified eating disorder: Secondary | ICD-10-CM

## 2021-06-23 DIAGNOSIS — F32A Depression, unspecified: Secondary | ICD-10-CM | POA: Diagnosis not present

## 2021-06-24 NOTE — Progress Notes (Signed)
  Office: [phone]  /  Fax: [phone]    Date: July 08, 2021   Appointment Start Time: 9:04am Duration: 25 minutes Provider: Lawerance Cruel, Psy.D. Type of Session: Individual Therapy  Location of Patient: Home (private location) Location of Provider: Provider's Home (private office) Type of Contact: Telepsychological Visit via MyChart Video Visit  Session Content: Anita Webb is a 40 y.o. female presenting for a follow-up appointment to address the previously established treatment goal of increasing coping skills.Today's appointment was a telepsychological visit due to COVID-19. [name] provided verbal consent for today's telepsychological appointment and she is aware she is responsible for securing confidentiality on her end of the session. Prior to proceeding with today's appointment, [name] physical location at the time of this appointment was obtained as well a phone number she could be reached at in the event of technical difficulties. [name] and this provider participated in today's telepsychological service.   This provider conducted a brief check-in. Self-compassion reviewed. Jacinta discussed engaging in meditation and focusing on her breathing when feeling anxious. She reported a desire to initiate traditional therapeutic services to address symptoms of anxiety, noting she has someone in mind. Reviewed eating habits since the last appointment with this provider. Psychoeducation regarding triggers for emotional eating was provided. [name] was provided a handout, and encouraged to utilize the handout between now and the next appointment to increase awareness of triggers and frequency. [name] agreed. This provider also discussed behavioral strategies for specific triggers, such as placing the utensil down when conversing to avoid mindless eating. [name] provided verbal consent during today's appointment for this provider to send a handout about triggers via e-mail. Overall, [name] was receptive to today's  appointment as evidenced by openness to sharing, responsiveness to feedback, and willingness to explore triggers for emotional eating.  Mental Status Examination:  Appearance: well groomed and appropriate hygiene  Behavior: appropriate to circumstances Mood: anxious Affect: mood congruent Speech: normal in rate, volume, and tone Eye Contact: appropriate Psychomotor Activity: appropriate Gait: unable to assess Thought Process: linear, logical, and goal directed  Thought Content/Perception: no hallucinations, delusions, bizarre thinking or behavior reported or observed and denies suicidal and homicidal ideation, plan, and intent Orientation: time, person, place, and purpose of appointment Memory/Concentration: memory, attention, language, and fund of knowledge intact  Insight/Judgment: fair  Interventions:  Conducted a brief chart review Conducted a risk assessment Provided empathic reflections and validation Reviewed content from the previous session Employed supportive psychotherapy interventions to facilitate reduced distress and to improve coping skills with identified stressors Psychoeducation provided regarding triggers for emotional eating  DSM-5 Diagnosis(es): F50.89 Other Specified Feeding or Eating Disorder, Emotional Eating Behaviors, F41.1 Generalized Anxiety Disorder, and F32.A Unspecified Depressive Disorder  Treatment Goal & Progress: During the initial appointment with this provider, the following treatment goal was established: increase coping skills. [name] demonstrated some progress in her goal as evidenced by increased awareness of the impact of anxiety on emotional eating behaviors and improvement in self-compassion.   Plan: [name] declined future appointments with this provider as she plans to initiate traditional therapeutic services to address anxiety. She acknowledged understanding that she may request a follow-up appointment with this provider in the future as long as  she is still established with the clinic. No further follow-up planned by this provider.

## 2021-07-08 ENCOUNTER — Telehealth (INDEPENDENT_AMBULATORY_CARE_PROVIDER_SITE_OTHER): Payer: 59 | Admitting: Psychology

## 2021-07-08 ENCOUNTER — Other Ambulatory Visit: Payer: Self-pay

## 2021-07-08 DIAGNOSIS — F32A Depression, unspecified: Secondary | ICD-10-CM

## 2021-07-08 DIAGNOSIS — F411 Generalized anxiety disorder: Secondary | ICD-10-CM

## 2021-07-08 DIAGNOSIS — F5089 Other specified eating disorder: Secondary | ICD-10-CM | POA: Diagnosis not present

## 2021-07-12 ENCOUNTER — Other Ambulatory Visit (INDEPENDENT_AMBULATORY_CARE_PROVIDER_SITE_OTHER): Payer: Self-pay | Admitting: Adult Health

## 2021-07-12 DIAGNOSIS — E559 Vitamin D deficiency, unspecified: Secondary | ICD-10-CM

## 2021-07-14 NOTE — Telephone Encounter (Signed)
LAST APPOINTMENT DATE: 06/19/21 NEXT APPOINTMENT DATE: 07/21/21   [facility] - [place], [place] - [address] [place] [address] Phone: [phone] Fax: [phone]  Patient is requesting a refill of the following medications: Pending Prescriptions:                       Disp   Refills   Vitamin D, Ergocalciferol, (DRISDOL) 1.25 *4 caps*0       Sig: Take 1 capsule by mouth once a week   Date last filled: 05/15/21 Previously prescribed by [facility]  Lab Results      Component                Value               Date                      HGBA1C                   5.4                 06/19/2021                HGBA1C                   5.5                 02/19/2021                HGBA1C                   6.8 (H)             08/29/2020           Lab Results      Component                Value               Date                      LDLCALC                  113 (H)             06/19/2021                CREATININE               0.81                06/19/2021           Lab Results      Component                Value               Date                      VD25OH                   56.9                06/19/2021                VD25OH                   47.3  02/19/2021                VD25OH                   27.0 (L)            08/29/2020            BP Readings from Last 3 Encounters: 06/19/21 : 138/84 05/15/21 : 127/80 04/24/21 : 126/73

## 2021-07-21 ENCOUNTER — Other Ambulatory Visit: Payer: Self-pay

## 2021-07-21 ENCOUNTER — Encounter (INDEPENDENT_AMBULATORY_CARE_PROVIDER_SITE_OTHER): Payer: Self-pay | Admitting: Adult Health

## 2021-07-21 ENCOUNTER — Ambulatory Visit (INDEPENDENT_AMBULATORY_CARE_PROVIDER_SITE_OTHER): Payer: 59 | Admitting: Adult Health

## 2021-07-21 VITALS — BP 145/86 | HR 64 | Temp 98.1°F | Ht 64.0 in | Wt 212.0 lb

## 2021-07-21 DIAGNOSIS — Z9189 Other specified personal risk factors, not elsewhere classified: Secondary | ICD-10-CM

## 2021-07-21 DIAGNOSIS — E1159 Type 2 diabetes mellitus with other circulatory complications: Secondary | ICD-10-CM

## 2021-07-21 DIAGNOSIS — E119 Type 2 diabetes mellitus without complications: Secondary | ICD-10-CM

## 2021-07-21 DIAGNOSIS — E1169 Type 2 diabetes mellitus with other specified complication: Secondary | ICD-10-CM

## 2021-07-21 DIAGNOSIS — E66813 Obesity, class 3: Secondary | ICD-10-CM

## 2021-07-21 DIAGNOSIS — I152 Hypertension secondary to endocrine disorders: Secondary | ICD-10-CM

## 2021-07-21 DIAGNOSIS — F411 Generalized anxiety disorder: Secondary | ICD-10-CM

## 2021-07-21 DIAGNOSIS — E559 Vitamin D deficiency, unspecified: Secondary | ICD-10-CM

## 2021-07-21 DIAGNOSIS — Z6841 Body Mass Index (BMI) 40.0 and over, adult: Secondary | ICD-10-CM

## 2021-07-21 DIAGNOSIS — E785 Hyperlipidemia, unspecified: Secondary | ICD-10-CM

## 2021-07-21 MED ORDER — VITAMIN D (ERGOCALCIFEROL) 1.25 MG (50000 UNIT) PO CAPS: 50000.0000 [IU] | ORAL_CAPSULE | ORAL | 0 refills | Status: DC

## 2021-07-21 MED ORDER — OZEMPIC (0.25 OR 0.5 MG/DOSE) 2 MG/1.5ML ~~LOC~~ SOPN: 0.2500 mg | PEN_INJECTOR | SUBCUTANEOUS | 0 refills | Status: DC

## 2021-07-21 NOTE — Progress Notes (Addendum)
Chief Complaint:   OBESITY Anita Webb is here to discuss her progress with her obesity treatment plan along with follow-up of her obesity related diagnoses. Anita Webb is on the Category 4 Plan and states she is following her eating plan approximately 80% of the time. Anita Webb states she is doing cardio for 30-60 minutes 3 times per week.  Today's visit was #: 15 Starting weight: 308 lbs Starting date: 08/29/2020 Today's weight: 212 lbs Today's date: 07/21/2021 Total lbs lost to date: 96 lbs  Total lbs lost since last in-office visit: 3 lbs  Interim History:  Anita Webb notes the following as challenges over the past month: 1) Holidays.   2)  Less willpower (has had to 2 MyChart visits with Dr. Dewaine Conger).   3)  Parents health in decline-  Mother has early onset dementia- recent/rapid change in health status the last 4-6 weeks Father several co-morbidities and overuse of ETOH   Subjective:   1. Type 2 diabetes mellitus without complication, without long-term current use of insulin (HCC) Discussed labs with patient today.  On 06/19/2021, BG 83, A1c 5.4, insulin level 7.1. Previously on metformin (diarrhea with some foods), Rybelsus (tolerated), Trulicity (rash). She denies family history of MTC or personal history of pancreatitis.  2. Vitamin D deficiency Discussed labs with patient today.  On 06/19/2021, vitamin D level - 56.9 - stable. She is currently taking prescription ergocalciferol 50,000 IU each week. She denies nausea, vomiting or muscle weakness.  3. Hyperlipidemia associated with type 2 diabetes mellitus (HCC) Discussed labs with patient today.  On 06/19/2021, lipid panel shows total and TG at goal.  HDL - low - 34, LDL 113 - above goal of 70 for diabetic. Her father has hyperlipidemia - treated by statin.  4. Hypertension associated with type 2 diabetes mellitus (HCC) Discussed labs with patient today.  BP elevated today, denies acute cardiac symptoms. She was stressed at check in  today. On 06/19/2021, CMP - stable.  She reports taking Hyzaar 50/12.5 mg consistently.   5. Generalized anxiety disorder On Lexapro 10 mg - denies SI/HI. She reports worsening anxiety, again declined referral to psychologist. Working with Dr. Dewaine Conger for emotional eating.  6. At risk for constipation Anita Webb is at increased risk for constipation due to being started on Ozempic 0.25 mg for diabetes.  Assessment/Plan:   1. Type 2 diabetes mellitus without complication, without long-term current use of insulin (HCC) Start Ozempic 0.25 mg once weekly.  - Start Semaglutide,0.25 or 0.5MG /DOS, (OZEMPIC, 0.25 OR 0.5 MG/DOSE,) 2 MG/1.5ML SOPN; Inject 0.25 mg into the skin once a week.  Dispense: 2 mL; Refill: 0  2. Vitamin D deficiency Refill ergocalciferol 50,000 IU once weekly.  - Refill Vitamin D, Ergocalciferol, (DRISDOL) 1.25 MG (50000 UNIT) CAPS capsule; Take 1 capsule (50,000 Units total) by mouth every 7 (seven) days.  Dispense: 4 capsule; Refill: 0  3. Hyperlipidemia associated with type 2 diabetes mellitus (HCC) Follow-up with PCP - recommend [employer].  4. Hypertension associated with type 2 diabetes mellitus (HCC) Monitor BP, use breathing technique to relax.  Continue walking/hiking.  5. Generalized anxiety disorder Continue Lexapro 10 mg, monitor for symptoms.   6. At risk for constipation Anita Webb was given approximately 15 minutes of counseling today regarding prevention of constipation. She was encouraged to increase water and fiber intake.    7. Obesity with current BMI of 36.4  Anita Webb is currently in the action stage of change. As such, her goal is to continue with weight  loss efforts. She has agreed to the Category 4 Plan.   Exercise goals:  As is.  Behavioral modification strategies: increasing lean protein intake, decreasing simple carbohydrates, increasing water intake, meal planning and cooking strategies, keeping healthy foods in the home, and planning for  success.  Anita Webb has agreed to follow-up with our clinic in 2 weeks. She was informed of the importance of frequent follow-up visits to maximize her success with intensive lifestyle modifications for her multiple health conditions.   Objective:   Blood pressure (!) 145/86, pulse 64, temperature 98.1 F (36.7 C), height 5\' 4"  (1.626 m), weight 212 lb (96.2 kg), SpO2 100 %. Body mass index is 36.39 kg/m.  General: Cooperative, alert, well developed, in no acute distress. HEENT: Conjunctivae and lids unremarkable. Cardiovascular: Regular rhythm.  Lungs: Normal work of breathing. Neurologic: No focal deficits.   Lab Results  Component Value Date   CREATININE 0.81 06/19/2021   BUN 20 06/19/2021   NA 143 06/19/2021   K 4.7 06/19/2021   CL 102 06/19/2021   CO2 24 06/19/2021   Lab Results  Component Value Date   ALT 17 06/19/2021   AST 21 06/19/2021   ALKPHOS 62 06/19/2021   BILITOT 0.5 06/19/2021   Lab Results  Component Value Date   HGBA1C 5.4 06/19/2021   HGBA1C 5.5 02/19/2021   HGBA1C 6.8 (H) 08/29/2020   HGBA1C 5.7 05/22/2019   HGBA1C 5.7 08/29/2018   Lab Results  Component Value Date   INSULIN 7.1 06/19/2021   INSULIN 7.1 02/19/2021   INSULIN 32.2 (H) 08/29/2020   Lab Results  Component Value Date   TSH 3.150 08/29/2020   Lab Results  Component Value Date   CHOL 168 06/19/2021   HDL 34 (L) 06/19/2021   LDLCALC 113 (H) 06/19/2021   LDLDIRECT 100.0 05/22/2019   TRIG 116 06/19/2021   CHOLHDL 4.9 (H) 06/19/2021   Lab Results  Component Value Date   VD25OH 56.9 06/19/2021   VD25OH 47.3 02/19/2021   VD25OH 27.0 (L) 08/29/2020   Lab Results  Component Value Date   WBC 6.9 08/29/2020   HGB 14.6 08/29/2020   HCT 42.6 08/29/2020   MCV 84 08/29/2020   PLT 195 08/29/2020   Attestation Statements:   Reviewed by clinician on day of visit: allergies, medications, problem list, medical history, surgical history, family history, social history, and previous  encounter notes.  I, 10/27/2020, CMA, am acting as Insurance claims handler for Energy manager, NP.  I have reviewed the above documentation for accuracy and completeness, and I agree with the above. -  Nakeda Lebron d. Kenslei Hearty, NP-C

## 2021-07-22 ENCOUNTER — Encounter (INDEPENDENT_AMBULATORY_CARE_PROVIDER_SITE_OTHER): Payer: Self-pay

## 2021-07-22 ENCOUNTER — Telehealth (INDEPENDENT_AMBULATORY_CARE_PROVIDER_SITE_OTHER): Payer: Self-pay | Admitting: Adult Health

## 2021-07-22 NOTE — Telephone Encounter (Signed)
Prior authorization approved for Ozempic. Patient sent mychart message.  

## 2021-08-06 ENCOUNTER — Other Ambulatory Visit: Payer: Self-pay

## 2021-08-06 ENCOUNTER — Encounter (INDEPENDENT_AMBULATORY_CARE_PROVIDER_SITE_OTHER): Payer: Self-pay | Admitting: Adult Health

## 2021-08-06 ENCOUNTER — Ambulatory Visit (INDEPENDENT_AMBULATORY_CARE_PROVIDER_SITE_OTHER): Payer: 59 | Admitting: Adult Health

## 2021-08-06 VITALS — BP 133/81 | HR 68 | Temp 98.1°F | Ht 64.0 in | Wt 205.0 lb

## 2021-08-06 DIAGNOSIS — Z9189 Other specified personal risk factors, not elsewhere classified: Secondary | ICD-10-CM

## 2021-08-06 DIAGNOSIS — E559 Vitamin D deficiency, unspecified: Secondary | ICD-10-CM | POA: Diagnosis not present

## 2021-08-06 DIAGNOSIS — E1159 Type 2 diabetes mellitus with other circulatory complications: Secondary | ICD-10-CM

## 2021-08-06 DIAGNOSIS — I152 Hypertension secondary to endocrine disorders: Secondary | ICD-10-CM

## 2021-08-06 DIAGNOSIS — E119 Type 2 diabetes mellitus without complications: Secondary | ICD-10-CM

## 2021-08-06 DIAGNOSIS — Z6841 Body Mass Index (BMI) 40.0 and over, adult: Secondary | ICD-10-CM

## 2021-08-06 MED ORDER — OZEMPIC (0.25 OR 0.5 MG/DOSE) 2 MG/1.5ML ~~LOC~~ SOPN: 0.5000 mg | PEN_INJECTOR | SUBCUTANEOUS | 0 refills | Status: DC

## 2021-08-06 MED ORDER — LOSARTAN POTASSIUM-HCTZ 50-12.5 MG PO TABS: 0.5000 | ORAL_TABLET | Freq: Every day | ORAL | 0 refills | Status: DC

## 2021-08-06 MED ORDER — VITAMIN D (ERGOCALCIFEROL) 1.25 MG (50000 UNIT) PO CAPS: 50000.0000 [IU] | ORAL_CAPSULE | ORAL | 0 refills | Status: DC

## 2021-08-07 NOTE — Progress Notes (Signed)
Chief Complaint:   OBESITY Anita Webb is here to discuss her progress with her obesity treatment plan along with follow-up of her obesity related diagnoses. Anita Webb is on the Category 4 Plan and states she is following her eating plan approximately 90% of the time. Anita Webb states she is not exercising regularly at this time.  Today's visit was #: 16 Starting weight: 308 lbs Starting date: 08/29/2020 Today's weight: 205 lbs Today's date: 08/06/2021 Total lbs lost to date: 103 lbs Total lbs lost since last in-office visit: 7 lbs  Interim History:  On 07/21/2021, Anita Webb started on Ozempic 0.25 mg.  She has had 2 doses - 3rd dose tomorrow. She states "I feel like I can finally stop the white knuckling with this medication".  Subjective:   1. Type 2 diabetes mellitus without complication, without long-term current use of insulin (HCC) On 07/21/2021, Delpha started on Ozempic 0.25 mg.  She has had 2 doses - 3rd dose tomorrow. She denies mass in neck, dysphagia, dyspepsia, persistent hoarseness, or GI upset. She reports stable appetite.   2. Vitamin D deficiency On 06/19/2021, vitamin D level - 56.9 - stable. She is currently taking prescription ergocalciferol 50,000 IU each week. She denies nausea, vomiting or muscle weakness.  3. Hypertension associated with type 2 diabetes mellitus (HCC) BP/HR stable.  4. At risk for constipation Anita Webb is at increased risk for constipation due to increasing GLP-1 for T2D.  Assessment/Plan:   1. Type 2 diabetes mellitus without complication, without long-term current use of insulin (HCC) Refill and increase Ozempic to 0.5 mg once weekly.  She will call insurance and inquire if Greggory Keen is less expensive and then call us with the information.  - Refill and increase Semaglutide,0.25 or 0.5MG /DOS, (OZEMPIC, 0.25 OR 0.5 MG/DOSE,) 2 MG/1.5ML SOPN; Inject 0.5 mg into the skin once a week.  Dispense: 8 mL; Refill: 0  2. Vitamin D deficiency Refill ergocalciferol  50,000 IU once weekly.  - Refill Vitamin D, Ergocalciferol, (DRISDOL) 1.25 MG (50000 UNIT) CAPS capsule; Take 1 capsule (50,000 Units total) by mouth every 7 (seven) days.  Dispense: 4 capsule; Refill: 0  3. Hypertension associated with type 2 diabetes mellitus (HCC) Refill Hyzaar 50/12.5 mg daily.  - Refill losartan-hydrochlorothiazide (HYZAAR) 50-12.5 MG tablet; Take 0.5 tablets by mouth daily.  Dispense: 45 tablet; Refill: 0  4. At risk for constipation Tata was given approximately 15 minutes of counseling today regarding prevention of constipation. She was encouraged to increase water and fiber intake.   5. Obesity with current BMI of 35.3  Anita Webb is currently in the action stage of change. As such, her goal is to continue with weight loss efforts. She has agreed to the Category 4 Plan.   Exercise goals:  Increase NEAT activities.  Behavioral modification strategies: increasing lean protein intake, decreasing simple carbohydrates, meal planning and cooking strategies, keeping healthy foods in the home, and planning for success.  Anita Webb has agreed to follow-up with our clinic in 3 weeks. She was informed of the importance of frequent follow-up visits to maximize her success with intensive lifestyle modifications for her multiple health conditions.   Objective:   Blood pressure 133/81, pulse 68, temperature 98.1 F (36.7 C), height 5\' 4"  (1.626 m), weight 205 lb (93 kg), SpO2 100 %. Body mass index is 35.19 kg/m.  General: Cooperative, alert, well developed, in no acute distress. HEENT: Conjunctivae and lids unremarkable. Cardiovascular: Regular rhythm.  Lungs: Normal work of breathing. Neurologic: No focal deficits.  Lab Results  Component Value Date   CREATININE 0.81 06/19/2021   BUN 20 06/19/2021   NA 143 06/19/2021   K 4.7 06/19/2021   CL 102 06/19/2021   CO2 24 06/19/2021   Lab Results  Component Value Date   ALT 17 06/19/2021   AST 21 06/19/2021   ALKPHOS 62  06/19/2021   BILITOT 0.5 06/19/2021   Lab Results  Component Value Date   HGBA1C 5.4 06/19/2021   HGBA1C 5.5 02/19/2021   HGBA1C 6.8 (H) 08/29/2020   HGBA1C 5.7 05/22/2019   HGBA1C 5.7 08/29/2018   Lab Results  Component Value Date   INSULIN 7.1 06/19/2021   INSULIN 7.1 02/19/2021   INSULIN 32.2 (H) 08/29/2020   Lab Results  Component Value Date   TSH 3.150 08/29/2020   Lab Results  Component Value Date   CHOL 168 06/19/2021   HDL 34 (L) 06/19/2021   LDLCALC 113 (H) 06/19/2021   LDLDIRECT 100.0 05/22/2019   TRIG 116 06/19/2021   CHOLHDL 4.9 (H) 06/19/2021   Lab Results  Component Value Date   VD25OH 56.9 06/19/2021   VD25OH 47.3 02/19/2021   VD25OH 27.0 (L) 08/29/2020   Lab Results  Component Value Date   WBC 6.9 08/29/2020   HGB 14.6 08/29/2020   HCT 42.6 08/29/2020   MCV 84 08/29/2020   PLT 195 08/29/2020   Attestation Statements:   Reviewed by clinician on day of visit: allergies, medications, problem list, medical history, surgical history, family history, social history, and previous encounter notes.  I, Insurance claims handler, CMA, am acting as Energy manager for William Hamburger, NP.  I have reviewed the above documentation for accuracy and completeness, and I agree with the above. -  Sora Olivo d. Ravin Denardo, NP-C

## 2021-08-27 ENCOUNTER — Other Ambulatory Visit: Payer: Self-pay

## 2021-08-27 ENCOUNTER — Ambulatory Visit (INDEPENDENT_AMBULATORY_CARE_PROVIDER_SITE_OTHER): Payer: 59 | Admitting: Adult Health

## 2021-08-27 ENCOUNTER — Encounter (INDEPENDENT_AMBULATORY_CARE_PROVIDER_SITE_OTHER): Payer: Self-pay | Admitting: Adult Health

## 2021-08-27 VITALS — BP 143/82 | HR 78 | Temp 97.9°F | Ht 64.0 in | Wt 211.0 lb

## 2021-08-27 DIAGNOSIS — Z6841 Body Mass Index (BMI) 40.0 and over, adult: Secondary | ICD-10-CM

## 2021-08-27 DIAGNOSIS — E119 Type 2 diabetes mellitus without complications: Secondary | ICD-10-CM | POA: Diagnosis not present

## 2021-08-27 NOTE — Progress Notes (Signed)
Chief Complaint:   OBESITY Kiaya is here to discuss her progress with her obesity treatment plan along with follow-up of her obesity related diagnoses. Matison is on the Category 4 Plan and states she is following her eating plan approximately 70% of the time. Shala states she is walking for 30 minutes 3 times per week.  Today's visit was #: 17 Starting weight: 308 lbs Starting date: 08/29/2020 Today's weight: 211 lbs Today's date: 08/27/2021 Total lbs lost to date: 97 lbs Total lbs lost since last in-office visit: 0  Interim History:  Arron started on Ozempic 0.25 mg on 07/21/2021 and was increased to 0.5 mg on 08/06/2021. She has only had 1 dose on this increased strength.  Subjective:   1. Type 2 diabetes mellitus without complication, without long-term current use of insulin (HCC) Mekia started on Ozempic 0.25 mg on 07/21/2021 and was increased to 0.5 mg on 08/06/2021. She has only had 1 dose on this increased strength- she injects on Thursdays. She denies mass in neck, dysphagia, dyspepsia, persistent hoarseness, or GI upset. Ambulatory FB 100-110s.  She denies symptoms of hypoglycemia. Per patient, Mounjaro/Ozempic - same cost via Office manager.  Assessment/Plan:   1. Type 2 diabetes mellitus without complication, without long-term current use of insulin (HCC) After 4th 0.5 mg Ozempic injection on 09/11/2020 -  Ms.Iribe will send MyChart message - with tolerance of dosage/appetite level - then refill Ozempic as appropriate.  2. Obesity with current BMI of 36.4  Hailley is currently in the action stage of change. As such, her goal is to continue with weight loss efforts. She has agreed to the Category 4 Plan.   Exercise goals:  As is.  Behavioral modification strategies: increasing lean protein intake, decreasing simple carbohydrates, meal planning and cooking strategies, keeping healthy foods in the home, and planning for success.  Eeva has agreed to follow-up with  our clinic in 3 weeks. She was informed of the importance of frequent follow-up visits to maximize her success with intensive lifestyle modifications for her multiple health conditions.   Objective:   Blood pressure (!) 143/82, pulse 78, temperature 97.9 F (36.6 C), height 5\' 4"  (1.626 m), weight 211 lb (95.7 kg), SpO2 100 %. Body mass index is 36.22 kg/m.  General: Cooperative, alert, well developed, in no acute distress. HEENT: Conjunctivae and lids unremarkable. Cardiovascular: Regular rhythm.  Lungs: Normal work of breathing. Neurologic: No focal deficits.   Lab Results  Component Value Date   CREATININE 0.81 06/19/2021   BUN 20 06/19/2021   NA 143 06/19/2021   K 4.7 06/19/2021   CL 102 06/19/2021   CO2 24 06/19/2021   Lab Results  Component Value Date   ALT 17 06/19/2021   AST 21 06/19/2021   ALKPHOS 62 06/19/2021   BILITOT 0.5 06/19/2021   Lab Results  Component Value Date   HGBA1C 5.4 06/19/2021   HGBA1C 5.5 02/19/2021   HGBA1C 6.8 (H) 08/29/2020   HGBA1C 5.7 05/22/2019   HGBA1C 5.7 08/29/2018   Lab Results  Component Value Date   INSULIN 7.1 06/19/2021   INSULIN 7.1 02/19/2021   INSULIN 32.2 (H) 08/29/2020   Lab Results  Component Value Date   TSH 3.150 08/29/2020   Lab Results  Component Value Date   CHOL 168 06/19/2021   HDL 34 (L) 06/19/2021   LDLCALC 113 (H) 06/19/2021   LDLDIRECT 100.0 05/22/2019   TRIG 116 06/19/2021   CHOLHDL 4.9 (H) 06/19/2021   Lab Results  Component Value Date   VD25OH 56.9 06/19/2021   VD25OH 47.3 02/19/2021   VD25OH 27.0 (L) 08/29/2020   Lab Results  Component Value Date   WBC 6.9 08/29/2020   HGB 14.6 08/29/2020   HCT 42.6 08/29/2020   MCV 84 08/29/2020   PLT 195 08/29/2020   Attestation Statements:   Reviewed by clinician on day of visit: allergies, medications, problem list, medical history, surgical history, family history, social history, and previous encounter notes.  Time spent on visit including  pre-visit chart review and post-visit care and charting was 28 minutes.   I, Insurance claims handler, CMA, am acting as Energy manager for William Hamburger, NP.  I have reviewed the above documentation for accuracy and completeness, and I agree with the above. -  Lieutenant Abarca d. Ramello Cordial, NP-C

## 2021-08-28 ENCOUNTER — Other Ambulatory Visit (INDEPENDENT_AMBULATORY_CARE_PROVIDER_SITE_OTHER): Payer: Self-pay | Admitting: Adult Health

## 2021-08-28 ENCOUNTER — Encounter (INDEPENDENT_AMBULATORY_CARE_PROVIDER_SITE_OTHER): Payer: Self-pay | Admitting: Adult Health

## 2021-08-28 DIAGNOSIS — E119 Type 2 diabetes mellitus without complications: Secondary | ICD-10-CM

## 2021-08-28 MED ORDER — OZEMPIC (0.25 OR 0.5 MG/DOSE) 2 MG/1.5ML ~~LOC~~ SOPN: 0.5000 mg | PEN_INJECTOR | SUBCUTANEOUS | 0 refills | Status: DC

## 2021-08-28 NOTE — Telephone Encounter (Signed)
Pt requesting refill on Ozempic. Was seen this week.

## 2021-09-11 ENCOUNTER — Ambulatory Visit: Payer: Self-pay | Admitting: Physician Assistant

## 2021-09-17 ENCOUNTER — Encounter (INDEPENDENT_AMBULATORY_CARE_PROVIDER_SITE_OTHER): Payer: Self-pay | Admitting: Adult Health

## 2021-09-17 ENCOUNTER — Other Ambulatory Visit: Payer: Self-pay

## 2021-09-17 ENCOUNTER — Ambulatory Visit (INDEPENDENT_AMBULATORY_CARE_PROVIDER_SITE_OTHER): Payer: 59 | Admitting: Adult Health

## 2021-09-17 VITALS — BP 132/78 | HR 74 | Temp 97.7°F | Ht 64.0 in | Wt 206.0 lb

## 2021-09-17 DIAGNOSIS — E1169 Type 2 diabetes mellitus with other specified complication: Secondary | ICD-10-CM

## 2021-09-17 DIAGNOSIS — E119 Type 2 diabetes mellitus without complications: Secondary | ICD-10-CM

## 2021-09-17 DIAGNOSIS — Z6835 Body mass index (BMI) 35.0-35.9, adult: Secondary | ICD-10-CM

## 2021-09-17 DIAGNOSIS — E559 Vitamin D deficiency, unspecified: Secondary | ICD-10-CM

## 2021-09-17 DIAGNOSIS — Z9189 Other specified personal risk factors, not elsewhere classified: Secondary | ICD-10-CM

## 2021-09-17 DIAGNOSIS — E669 Obesity, unspecified: Secondary | ICD-10-CM | POA: Diagnosis not present

## 2021-09-17 MED ORDER — VITAMIN D (ERGOCALCIFEROL) 1.25 MG (50000 UNIT) PO CAPS: 50000.0000 [IU] | ORAL_CAPSULE | ORAL | 0 refills | Status: DC

## 2021-09-18 NOTE — Progress Notes (Signed)
Chief Complaint:   OBESITY Anita Webb is here to discuss her progress with her obesity treatment plan along with follow-up of her obesity related diagnoses. Anita Webb is on the Category 4 Plan and states she is following her eating plan approximately 80% of the time. Anita Webb states she is walking/hiking for 30 minutes 2 times per week.  Today's visit was #: 18 Starting weight: 308 lbs Starting date: 08/29/2020 Today's weight: 206 lbs Today's date: 09/17/2021 Total lbs lost to date: 102 lbs Total lbs lost since last in-office visit: 5 lbs  Interim History:  Ranada started Ozempic on 07/21/2021. On 08/06/2021, Ozempic was increased to 0.5 mg - injects on Thursday. Has had 2 doses at this strength. She endorses constipation, denies nausea/mass in neck/persistent hoarseness.  Subjective:   1. Type 2 diabetes mellitus without complication, without long-term current use of insulin (HCC) Anita Webb started Ozempic on 07/21/2021. On 08/06/2021, Ozempic was increased to 0.5 mg - injects on Thursday. Has had 2 doses at this strength. She endorses constipation, denies nausea/mass in neck/persistent hoarseness. Ambulatory fasting 100-115.  2. Vitamin D deficiency Vitamin D level on 06/19/2021 - 56.9 - stable. She is currently taking prescription ergocalciferol 50,000 IU each week. She denies nausea, vomiting or muscle weakness.  3. At risk for nausea Anita Webb is at risk for nausea due to taking a GLP-1 for T2D.  Assessment/Plan:   1. Type 2 diabetes mellitus without complication, without long-term current use of insulin (HCC) Check fasting labs at next office visit. Continue Ozempic 0.5 mg once weekly. No refill needed.  2. Vitamin D deficiency Check fasting labs at next office visit. Refill ergocalciferol 50,000 IU once weekly.  - Refill Vitamin D, Ergocalciferol, (DRISDOL) 1.25 MG (50000 UNIT) CAPS capsule; Take 1 capsule (50,000 Units total) by mouth every 7 (seven) days.  Dispense: 4 capsule; Refill:  0  3. At risk for nausea Anita Webb was given approximately 15 minutes of nausea prevention counseling today. Anita Webb is at risk for nausea due to her new or current medication. She was encouraged to titrate her medication slowly, make sure to stay hydrated, eat smaller portions throughout the day, and avoid high fat meals.   4. Obesity with current BMI of 35.5  Anita Webb is currently in the action stage of change. As such, her goal is to continue with weight loss efforts. She has agreed to the Category 4 Plan.   Check fasting labs at next office visit.  Exercise goals:  As is.  Behavioral modification strategies: increasing lean protein intake, decreasing simple carbohydrates, meal planning and cooking strategies, keeping healthy foods in the home, and planning for success.  Anita Webb has agreed to follow-up with our clinic in 3 weeks, fasting. She was informed of the importance of frequent follow-up visits to maximize her success with intensive lifestyle modifications for her multiple health conditions.   Objective:   Blood pressure 132/78, pulse 74, temperature 97.7 F (36.5 C), height 5\' 4"  (1.626 m), weight 206 lb (93.4 kg), SpO2 100 %. Body mass index is 35.36 kg/m.  General: Cooperative, alert, well developed, in no acute distress. HEENT: Conjunctivae and lids unremarkable. Cardiovascular: Regular rhythm.  Lungs: Normal work of breathing. Neurologic: No focal deficits.   Lab Results  Component Value Date   CREATININE 0.81 06/19/2021   BUN 20 06/19/2021   NA 143 06/19/2021   K 4.7 06/19/2021   CL 102 06/19/2021   CO2 24 06/19/2021   Lab Results  Component Value Date  ALT 17 06/19/2021   AST 21 06/19/2021   ALKPHOS 62 06/19/2021   BILITOT 0.5 06/19/2021   Lab Results  Component Value Date   HGBA1C 5.4 06/19/2021   HGBA1C 5.5 02/19/2021   HGBA1C 6.8 (H) 08/29/2020   HGBA1C 5.7 05/22/2019   HGBA1C 5.7 08/29/2018   Lab Results  Component Value Date   INSULIN 7.1  06/19/2021   INSULIN 7.1 02/19/2021   INSULIN 32.2 (H) 08/29/2020   Lab Results  Component Value Date   TSH 3.150 08/29/2020   Lab Results  Component Value Date   CHOL 168 06/19/2021   HDL 34 (L) 06/19/2021   LDLCALC 113 (H) 06/19/2021   LDLDIRECT 100.0 05/22/2019   TRIG 116 06/19/2021   CHOLHDL 4.9 (H) 06/19/2021   Lab Results  Component Value Date   VD25OH 56.9 06/19/2021   VD25OH 47.3 02/19/2021   VD25OH 27.0 (L) 08/29/2020   Lab Results  Component Value Date   WBC 6.9 08/29/2020   HGB 14.6 08/29/2020   HCT 42.6 08/29/2020   MCV 84 08/29/2020   PLT 195 08/29/2020   Attestation Statements:   Reviewed by clinician on day of visit: allergies, medications, problem list, medical history, surgical history, family history, social history, and previous encounter notes.  I, Insurance claims handler, CMA, am acting as Energy manager for William Hamburger, NP.  I have reviewed the above documentation for accuracy and completeness, and I agree with the above. -  Jacub Waiters d. Anita Pizzuto, NP-C

## 2021-09-27 ENCOUNTER — Encounter (INDEPENDENT_AMBULATORY_CARE_PROVIDER_SITE_OTHER): Payer: Self-pay | Admitting: Adult Health

## 2021-10-08 ENCOUNTER — Other Ambulatory Visit: Payer: Self-pay

## 2021-10-08 ENCOUNTER — Encounter (INDEPENDENT_AMBULATORY_CARE_PROVIDER_SITE_OTHER): Payer: Self-pay | Admitting: Adult Health

## 2021-10-08 ENCOUNTER — Ambulatory Visit (INDEPENDENT_AMBULATORY_CARE_PROVIDER_SITE_OTHER): Payer: 59 | Admitting: Adult Health

## 2021-10-08 VITALS — BP 146/85 | HR 69 | Temp 97.8°F | Ht 64.0 in | Wt 211.0 lb

## 2021-10-08 DIAGNOSIS — Z6841 Body Mass Index (BMI) 40.0 and over, adult: Secondary | ICD-10-CM

## 2021-10-08 DIAGNOSIS — E669 Obesity, unspecified: Secondary | ICD-10-CM | POA: Diagnosis not present

## 2021-10-08 DIAGNOSIS — Z6836 Body mass index (BMI) 36.0-36.9, adult: Secondary | ICD-10-CM

## 2021-10-08 DIAGNOSIS — Z9189 Other specified personal risk factors, not elsewhere classified: Secondary | ICD-10-CM

## 2021-10-08 DIAGNOSIS — Z7985 Long-term (current) use of injectable non-insulin antidiabetic drugs: Secondary | ICD-10-CM

## 2021-10-08 DIAGNOSIS — E559 Vitamin D deficiency, unspecified: Secondary | ICD-10-CM

## 2021-10-08 DIAGNOSIS — E1169 Type 2 diabetes mellitus with other specified complication: Secondary | ICD-10-CM

## 2021-10-08 DIAGNOSIS — E119 Type 2 diabetes mellitus without complications: Secondary | ICD-10-CM

## 2021-10-08 MED ORDER — SEMAGLUTIDE (1 MG/DOSE) 4 MG/3ML ~~LOC~~ SOPN: 1.0000 mg | PEN_INJECTOR | SUBCUTANEOUS | 0 refills | Status: DC

## 2021-10-08 MED ORDER — VITAMIN D (ERGOCALCIFEROL) 1.25 MG (50000 UNIT) PO CAPS: 50000.0000 [IU] | ORAL_CAPSULE | ORAL | 0 refills | Status: DC

## 2021-10-08 NOTE — Progress Notes (Signed)
Chief Complaint:   OBESITY Anita Webb is here to discuss her progress with her obesity treatment plan along with follow-up of her obesity related diagnoses. Anita Webb is on the Category 4 Plan and states she is following her eating plan approximately 60% of the time. Anita Webb states she is hiking/walking for 30-45 minutes 2 times per week.  Today's visit was #: 19 Starting weight: 308 lbs Starting date: 08/29/2020 Today's weight: 211 lbs Today's date: 10/08/2021 Total lbs lost to date: 97 lbs Total lbs lost since last in-office visit: 0  Interim History:  Anita Webb has been at this weight loss plateau since fall 2022.  Of note - plans on establishing with BH/psychologist ASAP.  Subjective:   1. Vitamin D deficiency Vitamin D level on 06/19/2021 - 56.9. She is currently taking prescription ergocalciferol 50,000 IU each week. She denies nausea, vomiting or muscle weakness.  2. Type 2 diabetes mellitus without complication, without long-term current use of insulin (HCC) Ozempic 0.5 mg - 6 doses this strength. She denies mass in neck, dysphagia, dyspepsia, persistent hoarseness, abd pain, or GI upset. She will experience intermittent polyphagia.  3. At risk for nausea Anita Webb is at risk for nausea due to increasing Ozempic for T2D.  Assessment/Plan:   1. Vitamin D deficiency Refill ergocalciferol 50,000 IU once weekly.  - Refill Vitamin D, Ergocalciferol, (DRISDOL) 1.25 MG (50000 UNIT) CAPS capsule; Take 1 capsule (50,000 Units total) by mouth every 7 (seven) days.  Dispense: 4 capsule; Refill: 0  2. Type 2 diabetes mellitus without complication, without long-term current use of insulin (HCC) Refill and increase Ozempic to 1 mg once weekly.  - Refill and increase Semaglutide, 1 MG/DOSE, 4 MG/3ML SOPN; Inject 1 mg as directed once a week.  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 Anita Webb is at risk for  nausea due to her new or current medication. She was encouraged to titrate her medication slowly, make sure to stay hydrated, eat smaller portions throughout the day, and avoid high fat meals.   4. Obesity with current BMI of 36.2  Anita Webb is currently in the action stage of change. As such, her goal is to continue with weight loss efforts. She has agreed to the Category 4 Plan.   Check fasting labs at next office visit.  Exercise goals:  YouTube strength training x1.  Behavioral modification strategies: increasing lean protein intake, decreasing simple carbohydrates, meal planning and cooking strategies, keeping healthy foods in the home, and planning for success.  Anita Webb has agreed to follow-up with our clinic in 3 weeks. She was informed of the importance of frequent follow-up visits to maximize her success with intensive lifestyle modifications for her multiple health conditions.   Objective:   Blood pressure (!) 146/85, pulse 69, temperature 97.8 F (36.6 C), height 5\' 4"  (1.626 m), weight 211 lb (95.7 kg), SpO2 98 %. Body mass index is 36.22 kg/m.  General: Cooperative, alert, well developed, in no acute distress. HEENT: Conjunctivae and lids unremarkable. Cardiovascular: Regular rhythm.  Lungs: Normal work of breathing. Neurologic: No focal deficits.   Lab Results  Component Value Date   CREATININE 0.81 06/19/2021   BUN 20 06/19/2021   NA 143 06/19/2021   K 4.7 06/19/2021   CL 102 06/19/2021   CO2 24 06/19/2021   Lab Results  Component Value Date   ALT 17 06/19/2021   AST 21 06/19/2021  ALKPHOS 62 06/19/2021   BILITOT 0.5 06/19/2021   Lab Results  Component Value Date   HGBA1C 5.4 06/19/2021   HGBA1C 5.5 02/19/2021   HGBA1C 6.8 (H) 08/29/2020   HGBA1C 5.7 05/22/2019   HGBA1C 5.7 08/29/2018   Lab Results  Component Value Date   INSULIN 7.1 06/19/2021   INSULIN 7.1 02/19/2021   INSULIN 32.2 (H) 08/29/2020   Lab Results  Component Value Date   TSH 3.150  08/29/2020   Lab Results  Component Value Date   CHOL 168 06/19/2021   HDL 34 (L) 06/19/2021   LDLCALC 113 (H) 06/19/2021   LDLDIRECT 100.0 05/22/2019   TRIG 116 06/19/2021   CHOLHDL 4.9 (H) 06/19/2021   Lab Results  Component Value Date   VD25OH 56.9 06/19/2021   VD25OH 47.3 02/19/2021   VD25OH 27.0 (L) 08/29/2020   Lab Results  Component Value Date   WBC 6.9 08/29/2020   HGB 14.6 08/29/2020   HCT 42.6 08/29/2020   MCV 84 08/29/2020   PLT 195 08/29/2020   Attestation Statements:   Reviewed by clinician on day of visit: allergies, medications, problem list, medical history, surgical history, family history, social history, and previous encounter notes.  I, Insurance claims handler, CMA, am acting as Energy manager for William Hamburger, NP.  I have reviewed the above documentation for accuracy and completeness, and I agree with the above. -  Deyonte Cadden d. Chaske Paskett, NP-C

## 2021-10-27 ENCOUNTER — Ambulatory Visit (INDEPENDENT_AMBULATORY_CARE_PROVIDER_SITE_OTHER): Payer: 59 | Admitting: Adult Health

## 2021-10-30 ENCOUNTER — Encounter (INDEPENDENT_AMBULATORY_CARE_PROVIDER_SITE_OTHER): Payer: Self-pay

## 2021-11-04 NOTE — Telephone Encounter (Signed)
error 

## 2021-11-10 ENCOUNTER — Other Ambulatory Visit (INDEPENDENT_AMBULATORY_CARE_PROVIDER_SITE_OTHER): Payer: Self-pay | Admitting: Adult Health

## 2021-11-10 DIAGNOSIS — E119 Type 2 diabetes mellitus without complications: Secondary | ICD-10-CM

## 2021-11-10 MED ORDER — SEMAGLUTIDE (1 MG/DOSE) 4 MG/3ML ~~LOC~~ SOPN: 1.0000 mg | PEN_INJECTOR | SUBCUTANEOUS | 0 refills | Status: DC

## 2021-11-10 NOTE — Telephone Encounter (Signed)
duplicate

## 2021-11-10 NOTE — Telephone Encounter (Signed)
LAST APPOINTMENT DATE: 10/08/21 ?NEXT APPOINTMENT DATE: 11/09/21 ? ? ?[place] [place] [address] [address] ?Phone: [phone] Fax: [phone] ? ?Patient is requesting a refill of the following medications: ?Pending Prescriptions:                       Disp   Refills ?  Semaglutide, 1 MG/DOSE, 4 MG/3ML SOPN      3 mL   0       ?Sig: Inject 1 mg as directed once a week. ? ? ?Date last filled: 10/08/21 ?Previously prescribed by Valetta Fuller ? ?Lab Results ?     Component                Value               Date                 ?     HGBA1C                   5.4                 06/19/2021           ?     HGBA1C                   5.5                 02/19/2021           ?     HGBA1C                   6.8 (H)             08/29/2020           ?Lab Results ?     Component                Value               Date                 ?     LDLCALC                  113 (H)             06/19/2021           ?     CREATININE               0.81                06/19/2021           ?Lab Results ?     Component                Value               Date                 ?     VD25OH                   56.9                06/19/2021           ?     [place]  47.3                02/19/2021           ?     VD25OH                   27.0 (L)            08/29/2020           ? ?BP Readings from Last 3 Encounters: ?10/08/21 : (!) 146/85 ?09/17/21 : 132/78 ?08/27/21 : Marland Kitchen 143/82 ?

## 2021-11-18 ENCOUNTER — Ambulatory Visit (INDEPENDENT_AMBULATORY_CARE_PROVIDER_SITE_OTHER): Payer: 59 | Admitting: Adult Health

## 2021-11-19 ENCOUNTER — Ambulatory Visit (INDEPENDENT_AMBULATORY_CARE_PROVIDER_SITE_OTHER): Payer: 59 | Admitting: Adult Health

## 2021-12-07 ENCOUNTER — Other Ambulatory Visit (INDEPENDENT_AMBULATORY_CARE_PROVIDER_SITE_OTHER): Payer: Self-pay | Admitting: Adult Health

## 2021-12-07 DIAGNOSIS — F411 Generalized anxiety disorder: Secondary | ICD-10-CM

## 2021-12-08 ENCOUNTER — Ambulatory Visit (INDEPENDENT_AMBULATORY_CARE_PROVIDER_SITE_OTHER): Payer: 59 | Admitting: Adult Health

## 2021-12-10 ENCOUNTER — Ambulatory Visit (INDEPENDENT_AMBULATORY_CARE_PROVIDER_SITE_OTHER): Payer: 59 | Admitting: Adult Health

## 2021-12-10 ENCOUNTER — Encounter (INDEPENDENT_AMBULATORY_CARE_PROVIDER_SITE_OTHER): Payer: Self-pay | Admitting: Adult Health

## 2021-12-10 VITALS — BP 147/84 | HR 67 | Temp 97.7°F | Ht 64.0 in | Wt 221.0 lb

## 2021-12-10 DIAGNOSIS — E119 Type 2 diabetes mellitus without complications: Secondary | ICD-10-CM | POA: Diagnosis not present

## 2021-12-10 DIAGNOSIS — E669 Obesity, unspecified: Secondary | ICD-10-CM | POA: Diagnosis not present

## 2021-12-10 DIAGNOSIS — E559 Vitamin D deficiency, unspecified: Secondary | ICD-10-CM | POA: Diagnosis not present

## 2021-12-10 DIAGNOSIS — Z9189 Other specified personal risk factors, not elsewhere classified: Secondary | ICD-10-CM

## 2021-12-10 DIAGNOSIS — F411 Generalized anxiety disorder: Secondary | ICD-10-CM | POA: Diagnosis not present

## 2021-12-10 DIAGNOSIS — Z6841 Body Mass Index (BMI) 40.0 and over, adult: Secondary | ICD-10-CM

## 2021-12-10 DIAGNOSIS — Z7985 Long-term (current) use of injectable non-insulin antidiabetic drugs: Secondary | ICD-10-CM

## 2021-12-10 DIAGNOSIS — Z6838 Body mass index (BMI) 38.0-38.9, adult: Secondary | ICD-10-CM

## 2021-12-10 MED ORDER — ESCITALOPRAM OXALATE 10 MG PO TABS: 10.0000 mg | ORAL_TABLET | Freq: Every day | ORAL | 0 refills | Status: DC

## 2021-12-10 MED ORDER — VITAMIN D (ERGOCALCIFEROL) 1.25 MG (50000 UNIT) PO CAPS: 50000.0000 [IU] | ORAL_CAPSULE | ORAL | 0 refills | Status: DC

## 2021-12-11 ENCOUNTER — Other Ambulatory Visit (INDEPENDENT_AMBULATORY_CARE_PROVIDER_SITE_OTHER): Payer: Self-pay | Admitting: Adult Health

## 2021-12-11 ENCOUNTER — Encounter (INDEPENDENT_AMBULATORY_CARE_PROVIDER_SITE_OTHER): Payer: Self-pay | Admitting: Adult Health

## 2021-12-11 DIAGNOSIS — E119 Type 2 diabetes mellitus without complications: Secondary | ICD-10-CM

## 2021-12-11 LAB — COMPREHENSIVE METABOLIC PANEL
ALT: 20 IU/L (ref 0–32)
AST: 26 IU/L (ref 0–40)
Albumin/Globulin Ratio: 1.7 (ref 1.2–2.2)
Albumin: 4.5 g/dL (ref 3.8–4.8)
Alkaline Phosphatase: 57 IU/L (ref 44–121)
BUN/Creatinine Ratio: 25 — ABNORMAL HIGH (ref 9–23)
BUN: 18 mg/dL (ref 6–24)
Bilirubin Total: 0.4 mg/dL (ref 0.0–1.2)
CO2: 25 mmol/L (ref 20–29)
Calcium: 9 mg/dL (ref 8.7–10.2)
Chloride: 99 mmol/L (ref 96–106)
Creatinine, Ser: 0.73 mg/dL (ref 0.57–1.00)
Globulin, Total: 2.7 g/dL (ref 1.5–4.5)
Glucose: 76 mg/dL (ref 70–99)
Potassium: 4.2 mmol/L (ref 3.5–5.2)
Sodium: 140 mmol/L (ref 134–144)
Total Protein: 7.2 g/dL (ref 6.0–8.5)
eGFR: 105 mL/min/{1.73_m2} (ref >59)

## 2021-12-11 LAB — HEMOGLOBIN A1C
Est. average glucose Bld gHb Est-mCnc: 103 mg/dL
Hgb A1c MFr Bld: 5.2 % (ref 4.8–5.6)

## 2021-12-11 LAB — INSULIN, RANDOM: INSULIN: 7.3 u[IU]/mL (ref 2.6–24.9)

## 2021-12-11 LAB — VITAMIN D 25 HYDROXY (VIT D DEFICIENCY, FRACTURES): Vit D, 25-Hydroxy: 38.5 ng/mL (ref 30.0–100.0)

## 2021-12-11 MED ORDER — SEMAGLUTIDE (1 MG/DOSE) 4 MG/3ML ~~LOC~~ SOPN: 1.0000 mg | PEN_INJECTOR | SUBCUTANEOUS | 0 refills | Status: DC

## 2021-12-23 NOTE — Progress Notes (Signed)
? ? ? ?Chief Complaint:  ? ?OBESITY ?Anita Webb is here to discuss her progress with her obesity treatment plan along with follow-up of her obesity related diagnoses. Bethann is on the Category 4 Plan and states she is following her eating plan approximately 30% of the time. Ulana states she is exercising 0 minutes 0 times per week. ? ?Today's visit was #: 19 ?Starting weight: 308 lbs ?Starting date: 08/29/2020 ?Today's weight: 221 lbs ?Today's date: 12/10/2021 ?Total lbs lost to date: 54 ?Total lbs lost since last in-office visit: 10 ? ?Interim History:  ?Demetrious had an increase in her Ozempic to 1 mg on 10/08/21 - injection on Thursdays. ? She denies medication side effects.  ?Her bioimpedance results were discussed with pt: ?Muscle mass +6.2 lbs ?Adipose mass +4.4 lbs ?water mass +1.4 lbs. ? ?Subjective:  ? ?1. Vitamin D deficiency ?Cornell's vitamin D level was at 56.9-stable on 06/19/21. ? ?2. Type 2 diabetes mellitus without complication, without long-term current use of insulin ([place]) ?Kimesha's A1C level was at 5.4-at goal.  ?Cayci had an increase in her Ozempic to 1 mg on 10/08/21 - injection on Thursdays. ?She denies mass in neck, dysphagia, dyspepsia, persistent hoarseness, abd pain, or N/V/Constipation.  ? ?3. GAD (generalized anxiety disorder) ?Alvinia reports stable mood.  ?Denies suicidal ideas, and homicidal ideas.  ?She is currently on Lexapro 10 mg daily. ? ?4. At risk for nausea ?Due to GLP-1 therapy to treat T2D and obesity. ? ? ?Assessment/Plan:  ? ?1. Vitamin D deficiency ?Tanyanika will have Vit D labs checked today.  ?She agrees to continue to take prescription Vitamin D @50 ,000 IU every week. ? ?- Refill Vitamin D, Ergocalciferol, (DRISDOL) 1.25 MG (50000 UNIT) CAPS capsule; Take 1 capsule (50,000 Units total) by mouth every 7 (seven) days.  Dispense: 4 capsule; Refill: 0 ?- VITAMIN D 25 Hydroxy (Vit-D Deficiency, Fractures) ? ?2. Type 2 diabetes mellitus without complication, without long-term current use of insulin  ([place]) ?Adlee will have labs checked today.  ?We will increase Ozempic dosage if A1C is greater than 6. ?She is currently on Ozempic 1mg  once week. ? ?- Hemoglobin A1c ?- Insulin, random ?- Comprehensive metabolic panel ? ?3. GAD (generalized anxiety disorder) ?We will refill Lexapro 10 mg daily. ?- Refill escitalopram (LEXAPRO) 10 MG tablet; Take 1 tablet (10 mg total) by mouth daily.  Dispense: 90 tablet; Refill: 0 ? ?4. At risk for nausea ?Kris Mouton was given approximately 15 minutes of nausea prevention counseling today. Bertha is at risk for nausea due to her new or current medication. She was encouraged to titrate her medication slowly, make sure to stay hydrated, eat smaller portions throughout the day, and avoid high fat meals.  ? ? ?5. Obesity with current BMI of 38.1 ?Natusha is currently in the action stage of change. As such, her goal is to continue with weight loss efforts. She has agreed to the Category 4 Plan.  ? ?Exercise goals: As is. ? ?Behavioral modification strategies: increasing lean protein intake, decreasing simple carbohydrates, meal planning and cooking strategies, keeping healthy foods in the home, and planning for success. ? ?Addalyne has agreed to follow-up with our clinic in 4 weeks. She was informed of the importance of frequent follow-up visits to maximize her success with intensive lifestyle modifications for her multiple health conditions.  ? ?Ashtan was informed we would discuss her lab results at her next visit unless there is a critical issue that needs to be addressed sooner. Marty agreed to keep  her next visit at the agreed upon time to discuss these results. ? ?Objective:  ? ?Blood pressure (!) 147/84, pulse 67, temperature 97.7 ?F (36.5 ?C), height 5\' 4"  (1.626 m), weight 221 lb (100.2 kg), SpO2 100 %. ?Body mass index is 37.93 kg/m?. ? ?General: Cooperative, alert, well developed, in no acute distress. ?HEENT: Conjunctivae and lids unremarkable. ?Cardiovascular: Regular rhythm.   ?Lungs: Normal work of breathing. ?Neurologic: No focal deficits.  ? ?Lab Results  ?Component Value Date  ? CREATININE 0.73 12/10/2021  ? BUN 18 12/10/2021  ? NA 140 12/10/2021  ? K 4.2 12/10/2021  ? CL 99 12/10/2021  ? CO2 25 12/10/2021  ? ?Lab Results  ?Component Value Date  ? ALT 20 12/10/2021  ? AST 26 12/10/2021  ? ALKPHOS 57 12/10/2021  ? BILITOT 0.4 12/10/2021  ? ?Lab Results  ?Component Value Date  ? HGBA1C 5.2 12/10/2021  ? HGBA1C 5.4 06/19/2021  ? HGBA1C 5.5 02/19/2021  ? HGBA1C 6.8 (H) 08/29/2020  ? HGBA1C 5.7 05/22/2019  ? ?Lab Results  ?Component Value Date  ? INSULIN 7.3 12/10/2021  ? INSULIN 7.1 06/19/2021  ? INSULIN 7.1 02/19/2021  ? INSULIN 32.2 (H) 08/29/2020  ? ?Lab Results  ?Component Value Date  ? TSH 3.150 08/29/2020  ? ?Lab Results  ?Component Value Date  ? CHOL 168 06/19/2021  ? HDL 34 (L) 06/19/2021  ? LDLCALC 113 (H) 06/19/2021  ? LDLDIRECT 100.0 05/22/2019  ? TRIG 116 06/19/2021  ? CHOLHDL 4.9 (H) 06/19/2021  ? ?Lab Results  ?Component Value Date  ? VD25OH 38.5 12/10/2021  ? VD25OH 56.9 06/19/2021  ? VD25OH 47.3 02/19/2021  ? ?Lab Results  ?Component Value Date  ? WBC 6.9 08/29/2020  ? HGB 14.6 08/29/2020  ? HCT 42.6 08/29/2020  ? MCV 84 08/29/2020  ? PLT 195 08/29/2020  ? ?No results found for: IRON, TIBC, FERRITIN ? ? ?Attestation Statements:  ? ?Reviewed by clinician on day of visit: allergies, medications, problem list, medical history, surgical history, family history, social history, and previous encounter notes. ? ?I, Brendell Tyus, RMA, am acting as transcriptionist for Mina Marble, NP. ? ?I have reviewed the above documentation for accuracy and completeness, and I agree with the above. -  Chenise Mulvihill d. Tahj Njoku, NP-C ?

## 2022-01-06 ENCOUNTER — Ambulatory Visit (INDEPENDENT_AMBULATORY_CARE_PROVIDER_SITE_OTHER): Payer: 59 | Admitting: Adult Health

## 2022-01-06 ENCOUNTER — Encounter (INDEPENDENT_AMBULATORY_CARE_PROVIDER_SITE_OTHER): Payer: Self-pay | Admitting: Adult Health

## 2022-01-06 VITALS — BP 148/88 | HR 100 | Temp 97.7°F | Ht 64.0 in | Wt 234.0 lb

## 2022-01-06 DIAGNOSIS — E669 Obesity, unspecified: Secondary | ICD-10-CM | POA: Diagnosis not present

## 2022-01-06 DIAGNOSIS — Z6841 Body Mass Index (BMI) 40.0 and over, adult: Secondary | ICD-10-CM

## 2022-01-06 DIAGNOSIS — E559 Vitamin D deficiency, unspecified: Secondary | ICD-10-CM | POA: Diagnosis not present

## 2022-01-06 DIAGNOSIS — Z7985 Long-term (current) use of injectable non-insulin antidiabetic drugs: Secondary | ICD-10-CM

## 2022-01-06 DIAGNOSIS — E119 Type 2 diabetes mellitus without complications: Secondary | ICD-10-CM

## 2022-01-06 DIAGNOSIS — Z9189 Other specified personal risk factors, not elsewhere classified: Secondary | ICD-10-CM

## 2022-01-06 MED ORDER — VITAMIN D (ERGOCALCIFEROL) 1.25 MG (50000 UNIT) PO CAPS: 50000.0000 [IU] | ORAL_CAPSULE | ORAL | 0 refills | Status: DC

## 2022-01-06 MED ORDER — SEMAGLUTIDE (2 MG/DOSE) 8 MG/3ML ~~LOC~~ SOPN: 2.0000 mg | PEN_INJECTOR | SUBCUTANEOUS | 0 refills | Status: DC

## 2022-01-14 NOTE — Progress Notes (Unsigned)
Chief Complaint:   OBESITY Anita Webb is here to discuss her progress with her obesity treatment plan along with follow-up of her obesity related diagnoses. Anita Webb is on the Category 4 Plan and states she is following her eating plan approximately 50% of the time. Anita Webb states she is walking 30-45 minutes 3 times per week.  Today's visit was #: 20 Starting weight: 308 lbs Starting date: 08/29/2020 Today's weight: 234 lbs Today's date: 01/06/2022 Total lbs lost to date: 74 Total lbs lost since last in-office visit: 0  Interim History: Bioimepdance muscle mass +4.2 lbs, Adipose mass +8.2 lbs, Water weight +4.8 lbs. When eating off plan - binge eating behavior - 1 appointment with Dr. Dewaine Conger, she recommended that she be seen by [facility] Disorder eating/depression/anxiety. New patient appointment end of summer.  Subjective:   1. Type 2 diabetes mellitus without complication, without long-term current use of insulin (HCC) Discussed labs with Anita Webb today. On 12/10/21 A1c at 5.2- superb! Insulin level at 7.3, highest 32.2 (08/29/20), BG 76. Currently on Ozempic 1mg  once weekly ***. BG 90-100.  2. Vitamin D deficiency Discussed labs with today. On 12/10/21 Vit D level at 38.5- below goal of 50-70. She is currently taking ergocalciferol.  3. At risk for osteoporosis Anita Webb is at higher risk of osteopenia and osteoporosis due to Vitamin D deficiency and obesity.  Assessment/Plan:   1. Type 2 diabetes mellitus without complication, without long-term current use of insulin (HCC) We will refill Ozempic 2 mg once a week for 1 month with no refills.  -Refills Semaglutide, 2 MG/DOSE, 8 MG/3ML SOPN; Inject 2 mg as directed once a week.  Dispense: 3 mL; Refill: 0  2. Vitamin D deficiency We will increase ergocalciferol 50,000 twice a week for 1 month with no refills.  -Refill/increase Vitamin D, Ergocalciferol, (DRISDOL) 1.25 MG (50000 UNIT) CAPS capsule; Take 1 capsule (50,000 Units total)  by mouth every 3 (three) days.  Dispense: 8 capsule; Refill: 0  3. At risk for osteoporosis Anita Webb was given approximately 15 minutes of osteoporosis prevention counseling today. Anita Webb is at risk for osteopenia and osteoporosis due to her Vitamin D deficiency. She was encouraged to take her Vit D and follow her higher calcium diet and increase strengthening exercise to help strengthen her bones and decrease her risk of osteopenia and osteoporosis.  4. Obesity with current BMI of 40.2 Anita Webb is currently in the action stage of change. As such, her goal is to continue with weight loss efforts. She has agreed to the Category 4 Plan.   Exercise goals: As is.  Behavioral modification strategies: increasing lean protein intake, decreasing simple carbohydrates, meal planning and cooking strategies, keeping healthy foods in the home, and planning for success.  Anita Webb has agreed to follow-up with our clinic in 4 weeks. She was informed of the importance of frequent follow-up visits to maximize her success with intensive lifestyle modifications for her multiple health conditions.   Objective:   Blood pressure (!) 148/88, pulse 100, temperature 97.7 F (36.5 C), height 5\' 4"  (1.626 m), weight 234 lb (106.1 kg), SpO2 100 %. Body mass index is 40.17 kg/m.  General: Cooperative, alert, well developed, in no acute distress. HEENT: Conjunctivae and lids unremarkable. Cardiovascular: Regular rhythm.  Lungs: Normal work of breathing. Neurologic: No focal deficits.   Lab Results  Component Value Date   CREATININE 0.73 12/10/2021   BUN 18 12/10/2021   NA 140 12/10/2021   K 4.2 12/10/2021   CL  99 12/10/2021   CO2 25 12/10/2021   Lab Results  Component Value Date   ALT 20 12/10/2021   AST 26 12/10/2021   ALKPHOS 57 12/10/2021   BILITOT 0.4 12/10/2021   Lab Results  Component Value Date   HGBA1C 5.2 12/10/2021   HGBA1C 5.4 06/19/2021   HGBA1C 5.5 02/19/2021   HGBA1C 6.8 (H) 08/29/2020   HGBA1C 5.7  05/22/2019   Lab Results  Component Value Date   INSULIN 7.3 12/10/2021   INSULIN 7.1 06/19/2021   INSULIN 7.1 02/19/2021   INSULIN 32.2 (H) 08/29/2020   Lab Results  Component Value Date   TSH 3.150 08/29/2020   Lab Results  Component Value Date   CHOL 168 06/19/2021   HDL 34 (L) 06/19/2021   LDLCALC 113 (H) 06/19/2021   LDLDIRECT 100.0 05/22/2019   TRIG 116 06/19/2021   CHOLHDL 4.9 (H) 06/19/2021   Lab Results  Component Value Date   VD25OH 38.5 12/10/2021   VD25OH 56.9 06/19/2021   VD25OH 47.3 02/19/2021   Lab Results  Component Value Date   WBC 6.9 08/29/2020   HGB 14.6 08/29/2020   HCT 42.6 08/29/2020   MCV 84 08/29/2020   PLT 195 08/29/2020   No results found for: IRON, TIBC, FERRITIN  Attestation Statements:   Reviewed by clinician on day of visit: allergies, medications, problem list, medical history, surgical history, family history, social history, and previous encounter notes.  I, Madellyn Denio, RMA, am acting as transcriptionist for William Hamburger, NP.  I have reviewed the above documentation for accuracy and completeness, and I agree with the above. -  ***

## 2022-01-27 ENCOUNTER — Ambulatory Visit (INDEPENDENT_AMBULATORY_CARE_PROVIDER_SITE_OTHER): Payer: 59 | Admitting: Physician Assistant

## 2022-01-27 ENCOUNTER — Encounter: Payer: Self-pay | Admitting: Physician Assistant

## 2022-01-27 VITALS — BP 158/102 | HR 76 | Temp 98.7°F | Ht 64.0 in | Wt 246.6 lb

## 2022-01-27 DIAGNOSIS — F419 Anxiety disorder, unspecified: Secondary | ICD-10-CM

## 2022-01-27 DIAGNOSIS — Z808 Family history of malignant neoplasm of other organs or systems: Secondary | ICD-10-CM

## 2022-01-27 DIAGNOSIS — I152 Hypertension secondary to endocrine disorders: Secondary | ICD-10-CM

## 2022-01-27 DIAGNOSIS — L308 Other specified dermatitis: Secondary | ICD-10-CM

## 2022-01-27 DIAGNOSIS — Z23 Encounter for immunization: Secondary | ICD-10-CM | POA: Diagnosis not present

## 2022-01-27 DIAGNOSIS — E1159 Type 2 diabetes mellitus with other circulatory complications: Secondary | ICD-10-CM | POA: Diagnosis not present

## 2022-01-27 MED ORDER — BETAMETHASONE DIPROPIONATE 0.05 % EX CREA: TOPICAL_CREAM | Freq: Two times a day (BID) | CUTANEOUS | 0 refills | Status: DC

## 2022-01-27 NOTE — Progress Notes (Signed)
Subjective:    Patient ID: Anita Webb, female    DOB: August 21, 1981, 41 y.o.   MRN: [id number]  Chief Complaint  Patient presents with   Establish Care    New pt in office to establish care with PCP; pt hasn't seen PCP in past year; pt is pt at healthy weight and wellness; wants to know if we can take over her current prescriptions. Pt has exema on foot and wonders is there a Rx to help or need to go to dermatology    HPI 41 y.o. patient presents today for new patient establishment with me.  Patient has not had PCP in over a year.  Current Care Team: Currently with William Hamburger at [facility]  Dr. Dalbert Garnet for GYN   Acute Concerns: Eczematous rash on right foot ongoing for several months.  If she has not tried any steroid cream on it.  It is very itchy.  She is requesting referral to dermatology or prescription to try to help.  Chronic Concerns: See PMH listed below, as well as A/P for details on issues we specifically discussed during today's visit.      Past Medical History:  Diagnosis Date   B12 deficiency    Depression    Diabetes mellitus without complication (HCC)    [employer]reesia 09/30/2020   Fatty liver    Hypertension    Joint pain    PCOS (polycystic ovarian syndrome)    Vitamin D deficiency     Past Surgical History:  Procedure Laterality Date   CESAREAN SECTION  02/2007   FOOT SURGERY  2000   bone removed from left heel     Family History  Problem Relation Age of Onset   Hyperlipidemia Mother    Hypertension Mother    Dementia Mother    Thyroid disease Mother    Depression Mother    Obesity Mother    Hyperlipidemia Father    Hypertension Father    Cancer Maternal Grandmother        breast CA   Diabetes Maternal Grandmother    Breast cancer Maternal Grandmother 46   Hypertension Maternal Grandfather    Heart disease Maternal Grandfather    Hyperlipidemia Maternal Grandfather    Heart disease Paternal Grandmother    Hypertension  Paternal Grandmother     Social History   Tobacco Use   Smoking status: Never   Smokeless tobacco: Never  Vaping Use   Vaping Use: Never used  Substance Use Topics   Alcohol use: Yes    Comment: 1-2 drink per week   Drug use: No     Allergies  Allergen Reactions   Erythromycin     GI upset   Trulicity [Dulaglutide] Rash    Review of Systems NEGATIVE UNLESS OTHERWISE INDICATED IN HPI      Objective:     BP (!) 158/102 (BP Location: Left Arm)   Pulse 76   Temp 98.7 F (37.1 C) (Temporal)   Ht 5\' 4"  (1.626 m)   Wt 246 lb 9.6 oz (111.9 kg)   SpO2 98%   BMI 42.33 kg/m   Wt Readings from Last 3 Encounters:  01/27/22 246 lb 9.6 oz (111.9 kg)  01/06/22 234 lb (106.1 kg)  12/10/21 221 lb (100.2 kg)    BP Readings from Last 3 Encounters:  01/27/22 (!) 158/102  01/06/22 (!) 148/88  12/10/21 (!) 147/84     Physical Exam Vitals and nursing note reviewed.  Constitutional:  Appearance: Normal appearance. She is obese. She is not toxic-appearing.  HENT:     Head: Normocephalic and atraumatic.     Right Ear: Tympanic membrane, ear canal and external ear normal.     Left Ear: Tympanic membrane, ear canal and external ear normal.     Nose: Nose normal.     Mouth/Throat:     Mouth: Mucous membranes are moist.  Eyes:     Extraocular Movements: Extraocular movements intact.     Conjunctiva/sclera: Conjunctivae normal.     Pupils: Pupils are equal, round, and reactive to light.  Cardiovascular:     Rate and Rhythm: Normal rate and regular rhythm.     Pulses: Normal pulses.     Heart sounds: Normal heart sounds.  Pulmonary:     Effort: Pulmonary effort is normal.     Breath sounds: Normal breath sounds.  Musculoskeletal:        General: Normal range of motion.     Cervical back: Normal range of motion and neck supple.  Skin:    General: Skin is warm and dry.     Findings: Rash (R dorsal surface of foot excoriation, dryness, skin changes from chronic  scratching and ?eczema) present.  Neurological:     General: No focal deficit present.     Mental Status: She is alert and oriented to person, place, and time.  Psychiatric:        Mood and Affect: Mood normal.        Behavior: Behavior normal.        Thought Content: Thought content normal.        Judgment: Judgment normal.        Assessment & Plan:   Problem List Items Addressed This Visit       Cardiovascular and Mediastinum   Hypertension associated with type 2 diabetes mellitus (HCC)     Other   Anxiety   Other Visit Diagnoses     Other eczema    -  Primary   Relevant Orders   Ambulatory referral to Dermatology   Family history of melanoma       Relevant Orders   Ambulatory referral to Dermatology   Need for prophylactic vaccination with combined diphtheria-tetanus-pertussis (DTP) vaccine       Relevant Orders   Tdap vaccine greater than or equal to 7yo IM (Completed)        Meds ordered this encounter  Medications   betamethasone dipropionate 0.05 % cream    Sig: Apply topically 2 (two) times daily.    Dispense:  45 g    Refill:  0    Order Specific Question:   Supervising Provider    Answer:   Tana Conch O [[id number]]   1. Other eczema -Right foot - no treatments tried yet; betamethasone cream as directed; keep moisturized -Referral to derm as waiting list is a ways out, may need additional help if worse or no imp  2. Hypertension associated with type 2 diabetes mellitus (HCC) -Elevated, possibly 2/2 new pt visit -She's on Hyzaar 50-12.5 mg daily -Asked to monitor at home, may need additional tx if continues to stay elevated  3. Anxiety -Wanting to wean off Lexapro -See AVS for discussion on this   4. Family history of melanoma -Requesting referral to derm, obliged  5. Need for prophylactic vaccination with combined diphtheria-tetanus-pertussis (DTP) vaccine Updated vaccine    Return in about 6 months (around 07/29/2022) for fasting labs,  recheck .  This note  was prepared with assistance of Systems analyst. Occasional wrong-word or sound-a-like substitutions may have occurred due to the inherent limitations of voice recognition software.   Corayma Cashatt M Montez Cuda, PA-C

## 2022-01-27 NOTE — Patient Instructions (Addendum)
Welcome to [facility]! It was a pleasure meeting you today.  As discussed, Please schedule a 6 month follow up visit today.  Please use the betamethasone cream on your foot as directed.  Reach back out in the next few weeks to let me know if there is any improvement in symptoms or not.  I have sent a referral to dermatology for overall skin check as well.  You may wean off the Lexapro at this time.  Try to take 1/2 tablet once daily for the next month and then come off the medication.  Reach back out if you have any questions or concerns.  PLEASE NOTE:  If you had any LAB tests please let us know if you have not heard back within a few days. You may see your results on MyChart before we have a chance to review them but we will give you a call once they are reviewed by [place]. If we ordered any REFERRALS today, please let us know if you have not heard from their office within the next two weeks. Let us know through MyChart if you are needing REFILLS, or have your pharmacy send [place] the request. You can also use MyChart to communicate with me or any office staff.  Please try these tips to maintain a healthy lifestyle:  Eat most of your calories during the day when you are active. Eliminate processed foods including packaged sweets (pies, cakes, cookies), reduce intake of potatoes, white bread, white pasta, and white rice. Look for whole grain options, oat flour or almond flour.  Each meal should contain half fruits/vegetables, one quarter protein, and one quarter carbs (no bigger than a computer mouse).  Cut down on sweet beverages. This includes juice, soda, and sweet tea. Also watch fruit intake, though this is a healthier sweet option, it still contains natural sugar! Limit to 3 servings daily.  Drink at least 1 glass of water with each meal and aim for at least 8 glasses (64 ounces) per day.  Exercise at least 150 minutes every week to the best of your ability.     Take Care,  Jakiah Goree, PA-C

## 2022-01-30 ENCOUNTER — Other Ambulatory Visit (INDEPENDENT_AMBULATORY_CARE_PROVIDER_SITE_OTHER): Payer: Self-pay | Admitting: Adult Health

## 2022-01-30 DIAGNOSIS — E119 Type 2 diabetes mellitus without complications: Secondary | ICD-10-CM

## 2022-02-01 ENCOUNTER — Encounter (INDEPENDENT_AMBULATORY_CARE_PROVIDER_SITE_OTHER): Payer: Self-pay | Admitting: Adult Health

## 2022-02-02 ENCOUNTER — Ambulatory Visit (INDEPENDENT_AMBULATORY_CARE_PROVIDER_SITE_OTHER): Payer: 59 | Admitting: Adult Health

## 2022-02-04 ENCOUNTER — Other Ambulatory Visit (INDEPENDENT_AMBULATORY_CARE_PROVIDER_SITE_OTHER): Payer: Self-pay | Admitting: Adult Health

## 2022-02-04 DIAGNOSIS — E559 Vitamin D deficiency, unspecified: Secondary | ICD-10-CM

## 2022-02-04 DIAGNOSIS — E119 Type 2 diabetes mellitus without complications: Secondary | ICD-10-CM

## 2022-02-04 MED ORDER — VITAMIN D (ERGOCALCIFEROL) 1.25 MG (50000 UNIT) PO CAPS: 50000.0000 [IU] | ORAL_CAPSULE | ORAL | 0 refills | Status: DC

## 2022-02-04 MED ORDER — SEMAGLUTIDE (2 MG/DOSE) 8 MG/3ML ~~LOC~~ SOPN: 2.0000 mg | PEN_INJECTOR | SUBCUTANEOUS | 0 refills | Status: DC

## 2022-02-04 NOTE — Telephone Encounter (Signed)
LAST APPOINTMENT DATE: 01/06/22 NEXT APPOINTMENT DATE: 03/03/22   [facility] - [place], [place] - [address] [place] [address] Phone: [phone] Fax: [phone]  Patient is requesting a refill of the following medications: Pending Prescriptions:                       Disp   Refills   Vitamin D, Ergocalciferol, (DRISDOL) 1.25 *8 caps*0       Sig: Take 1 capsule (50,000 Units total) by mouth every 3          (three) days.   Semaglutide, 2 MG/DOSE, 8 MG/3ML SOPN      3 mL   0       Sig: Inject 2 mg as directed once a week.   Date last filled: 01/06/22 Previously prescribed by [facility]  Lab Results      Component                Value               Date                      HGBA1C                   5.2                 12/10/2021                HGBA1C                   5.4                 06/19/2021                HGBA1C                   5.5                 02/19/2021           Lab Results      Component                Value               Date                      LDLCALC                  113 (H)             06/19/2021                CREATININE               0.73                12/10/2021           Lab Results      Component                Value               Date                      VD25OH                   38.5  12/10/2021                VD25OH                   56.9                06/19/2021                VD25OH                   47.3                02/19/2021            BP Readings from Last 3 Encounters: 01/27/22 : (!) 158/102 01/06/22 : (!) 148/88 12/10/21 : (!) 147/84

## 2022-02-18 ENCOUNTER — Encounter: Payer: Self-pay | Admitting: Physician Assistant

## 2022-02-18 ENCOUNTER — Other Ambulatory Visit: Payer: Self-pay | Admitting: *Deleted

## 2022-02-18 DIAGNOSIS — E1159 Type 2 diabetes mellitus with other circulatory complications: Secondary | ICD-10-CM

## 2022-02-18 DIAGNOSIS — E119 Type 2 diabetes mellitus without complications: Secondary | ICD-10-CM

## 2022-02-18 DIAGNOSIS — E559 Vitamin D deficiency, unspecified: Secondary | ICD-10-CM

## 2022-02-18 MED ORDER — LOSARTAN POTASSIUM-HCTZ 50-12.5 MG PO TABS: 0.5000 | ORAL_TABLET | Freq: Every day | ORAL | 0 refills | Status: DC

## 2022-02-18 MED ORDER — VITAMIN D (ERGOCALCIFEROL) 1.25 MG (50000 UNIT) PO CAPS: 50000.0000 [IU] | ORAL_CAPSULE | ORAL | 0 refills | Status: DC

## 2022-02-18 MED ORDER — SEMAGLUTIDE (2 MG/DOSE) 8 MG/3ML ~~LOC~~ SOPN: 2.0000 mg | PEN_INJECTOR | SUBCUTANEOUS | 0 refills | Status: DC

## 2022-02-18 NOTE — Telephone Encounter (Signed)
Rx send to pharmacy  

## 2022-02-18 NOTE — Telephone Encounter (Signed)
Please advise 

## 2022-03-03 ENCOUNTER — Ambulatory Visit (INDEPENDENT_AMBULATORY_CARE_PROVIDER_SITE_OTHER): Payer: 59 | Admitting: Adult Health

## 2022-03-31 ENCOUNTER — Other Ambulatory Visit: Payer: Self-pay | Admitting: Physician Assistant

## 2022-03-31 DIAGNOSIS — E119 Type 2 diabetes mellitus without complications: Secondary | ICD-10-CM

## 2022-04-01 ENCOUNTER — Encounter (INDEPENDENT_AMBULATORY_CARE_PROVIDER_SITE_OTHER): Payer: Self-pay

## 2022-04-29 ENCOUNTER — Other Ambulatory Visit: Payer: Self-pay | Admitting: Physician Assistant

## 2022-04-29 DIAGNOSIS — E119 Type 2 diabetes mellitus without complications: Secondary | ICD-10-CM

## 2022-06-04 ENCOUNTER — Encounter: Payer: Self-pay | Admitting: Physician Assistant

## 2022-06-05 ENCOUNTER — Other Ambulatory Visit: Payer: Self-pay

## 2022-06-05 DIAGNOSIS — E119 Type 2 diabetes mellitus without complications: Secondary | ICD-10-CM

## 2022-06-05 MED ORDER — OZEMPIC (2 MG/DOSE) 8 MG/3ML ~~LOC~~ SOPN
PEN_INJECTOR | SUBCUTANEOUS | 0 refills | Status: DC
  Filled 2022-06-05: qty 3, 28d supply, fill #0

## 2022-07-01 NOTE — Telephone Encounter (Signed)
Submitted PA for Ozempic (0.25 or 0.5 MG/DOSE) Waiting for determination. [id number]

## 2022-07-06 ENCOUNTER — Telehealth (INDEPENDENT_AMBULATORY_CARE_PROVIDER_SITE_OTHER): Payer: Self-pay

## 2022-07-06 NOTE — Telephone Encounter (Signed)
PA approved for Ozempic (0.25 or 0.5MG /Dose) until 07/02/2023

## 2022-07-08 ENCOUNTER — Other Ambulatory Visit: Payer: Self-pay | Admitting: Physician Assistant

## 2022-07-08 DIAGNOSIS — I152 Hypertension secondary to endocrine disorders: Secondary | ICD-10-CM

## 2022-07-13 DIAGNOSIS — B338 Other specified viral diseases: Secondary | ICD-10-CM | POA: Insufficient documentation

## 2022-07-25 ENCOUNTER — Ambulatory Visit
Admission: EM | Admit: 2022-07-25 | Discharge: 2022-07-25 | Disposition: A | Discharge status: Home / Self Care | Payer: 59 | Attending: Internal Medicine | Admitting: Internal Medicine

## 2022-07-25 ENCOUNTER — Encounter: Payer: Self-pay | Admitting: Emergency Medicine

## 2022-07-25 ENCOUNTER — Ambulatory Visit (INDEPENDENT_AMBULATORY_CARE_PROVIDER_SITE_OTHER): Payer: 59

## 2022-07-25 DIAGNOSIS — R053 Chronic cough: Secondary | ICD-10-CM

## 2022-07-25 DIAGNOSIS — H6123 Impacted cerumen, bilateral: Secondary | ICD-10-CM | POA: Diagnosis not present

## 2022-07-25 DIAGNOSIS — I10 Essential (primary) hypertension: Secondary | ICD-10-CM

## 2022-07-25 DIAGNOSIS — H6591 Unspecified nonsuppurative otitis media, right ear: Secondary | ICD-10-CM

## 2022-07-25 DIAGNOSIS — B338 Other specified viral diseases: Secondary | ICD-10-CM

## 2022-07-25 DIAGNOSIS — R059 Cough, unspecified: Secondary | ICD-10-CM | POA: Diagnosis not present

## 2022-07-25 DIAGNOSIS — H65192 Other acute nonsuppurative otitis media, left ear: Secondary | ICD-10-CM

## 2022-07-25 DIAGNOSIS — R000 Tachycardia, unspecified: Secondary | ICD-10-CM | POA: Diagnosis not present

## 2022-07-25 MED ORDER — PREDNISONE 20 MG PO TABS: 20.0000 mg | ORAL_TABLET | Freq: Every day | ORAL | 0 refills | Status: AC

## 2022-07-25 MED ORDER — BENZONATATE 100 MG PO CAPS: 100.0000 mg | ORAL_CAPSULE | Freq: Three times a day (TID) | ORAL | 0 refills | Status: DC | PRN

## 2022-07-25 MED ORDER — AMOXICILLIN-POT CLAVULANATE 875-125 MG PO TABS: 1.0000 | ORAL_TABLET | Freq: Two times a day (BID) | ORAL | 0 refills | Status: DC

## 2022-07-25 NOTE — ED Provider Notes (Signed)
[facility]    CSN: [id number] Arrival date & time: 07/25/22  9166      History   Chief Complaint Chief Complaint  Patient presents with   Tinnitus   Cough    HPI Anita Webb is a 41 y.o. female.   Patient presents today due to feelings of ringing in the left ear and feelings of "ear fullness" as well as productive cough which have both been present for about 2 weeks.  She was seen on 07/13/2022 at a different urgent care and tested positive for RSV.  She reports the upper respiratory symptoms have mainly resolved but she is left with a cough and ear discomfort.  She denies any known fevers at home.  Denies chest pain, shortness of breath, sore throat, nausea, vomiting, diarrhea, abdominal pain.  She was prescribed a couple different cough medications and an inhaler at previous urgent care visit with minimal improvement in symptoms.  She denies a formal diagnosis of asthma or COPD.  She also has significantly elevated blood pressure reading.  She reports that she takes losartan HCTZ 50 mg 12.5 mg daily.  She does not check her blood pressure at home.  She last saw PCP in June 2023 and has a follow-up appointment on December 19.  She denies any associated chest pain, headache, shortness of breath, nausea, vomiting, dizziness, blurred vision.  She also has mildly elevated heart rate which is not baseline for her.  She reports that she has been "dealing with her mother who has dementia this morning" so she is not sure if symptoms are attributed to this.   Cough   Past Medical History:  Diagnosis Date   B12 deficiency    Depression    Diabetes mellitus without complication ([place])    Phreesia 09/30/2020   Fatty liver    Hypertension    Joint pain    PCOS (polycystic ovarian syndrome)    Vitamin D deficiency     Patient Active Problem List   Diagnosis Date Noted   Anxiety 06/19/2021   GAD (generalized anxiety disorder) 12/11/2020   Class 3 severe obesity with  serious comorbidity and body mass index (BMI) of 50.0 to 59.9 in adult ([place]) 12/11/2020   Encounter for IUD insertion 09/30/2020   Hyperlipidemia associated with type 2 diabetes mellitus ([place]) 09/12/2020   Diabetes mellitus ([place]) 09/12/2020   Hypertension associated with type 2 diabetes mellitus ([place]) 09/12/2020   At risk for heart disease 09/12/2020   Other fatigue 08/29/2020   SOBOE (shortness of breath on exertion) 06/00/4599   Nonalcoholic hepatosteatosis 77/41/4239   PCOS (polycystic ovarian syndrome) 08/29/2020   B12 deficiency 08/29/2020   At risk for impaired metabolic function 53/20/2334   Depression 08/29/2020   Retroverted uterus 11/07/2019   Elevated LFTs 10/31/2018   Hepatic steatosis 10/31/2018   Primary insomnia 06/25/2018   Hirsutism 06/25/2018   Amenorrhea 06/25/2018   Essential hypertension 06/25/2018   Insulin resistance 06/25/2018   Pure hypercholesterolemia 06/25/2018   Vitamin D deficiency 06/25/2018   Allergic rhinitis 02/22/2015   Morbid obesity ([place]) 02/22/2015    Past Surgical History:  Procedure Laterality Date   CESAREAN SECTION  02/2007   FOOT SURGERY  2000   bone removed from left heel     OB History     Gravida  2   Para  2   Term      Preterm      AB      Living  SAB      IAB      Ectopic      Multiple      Live Births               Home Medications    Prior to Admission medications   Medication Sig Start Date End Date Taking? Authorizing Provider  amoxicillin-clavulanate (AUGMENTIN) 875-125 MG tablet Take 1 tablet by mouth every 12 (twelve) hours. 07/25/22  Yes Ruddy Swire, Hildred Alamin E, FNP  benzonatate (TESSALON) 100 MG capsule Take 1 capsule (100 mg total) by mouth every 8 (eight) hours as needed for cough. 07/25/22  Yes Azana Kiesler, Hildred Alamin E, FNP  predniSONE (DELTASONE) 20 MG tablet Take 1 tablet (20 mg total) by mouth daily for 5 days. 07/25/22 07/30/22 Yes Andilynn Delavega, Hildred Alamin E, FNP  betamethasone dipropionate 0.05 % cream Apply  topically 2 (two) times daily. 01/27/22   Allwardt, Randa Evens, PA-C  Blood Glucose Monitoring Suppl (TRUE METRIX METER) w/Device KIT Use as directed 10/03/20   Camillia Herter, NP  escitalopram (LEXAPRO) 10 MG tablet Take 1 tablet (10 mg total) by mouth daily. 12/10/21   Danford, Valetta Fuller D, NP  glucose blood (TRUE METRIX BLOOD GLUCOSE TEST) test strip Use as instructed 10/03/20   Camillia Herter, NP  levonorgestrel (LILETTA) 19.5 MCG/DAY IUD IUD 1 each by Intrauterine route once.    [provider]  losartan-hydrochlorothiazide (HYZAAR) 50-12.5 MG tablet Take 1/2 (one-half) tablet by mouth once daily 07/09/22   Allwardt, Alyssa M, PA-C  Semaglutide, 2 MG/DOSE, (OZEMPIC, 2 MG/DOSE,) 8 MG/3ML SOPN INJECT 2 MG SUBCUTANEOUSLY ONCE WEEKLY AS DIRECTED 06/05/22   Allwardt, Randa Evens, PA-C  TRUEplus Lancets 28G MISC Use as directed 10/03/20   Camillia Herter, NP  Vitamin D, Ergocalciferol, (DRISDOL) 1.25 MG (50000 UNIT) CAPS capsule Take 1 capsule (50,000 Units total) by mouth every 3 (three) days. 02/18/22   Allwardt, Randa Evens, PA-C    Family History Family History  Problem Relation Age of Onset   Hyperlipidemia Mother    Hypertension Mother    Dementia Mother    Thyroid disease Mother    Depression Mother    Obesity Mother    Hyperlipidemia Father    Hypertension Father    Cancer Maternal Grandmother        breast CA   Diabetes Maternal Grandmother    Breast cancer Maternal Grandmother 66   Hypertension Maternal Grandfather    Heart disease Maternal Grandfather    Hyperlipidemia Maternal Grandfather    Heart disease Paternal Grandmother    Hypertension Paternal Grandmother     Social History Social History   Tobacco Use   Smoking status: Never   Smokeless tobacco: Never  Vaping Use   Vaping Use: Never used  Substance Use Topics   Alcohol use: Yes    Comment: 1-2 drink per week   Drug use: No     Allergies   Erythromycin and Trulicity [dulaglutide]   Review of  Systems Review of Systems Per HPI  Physical Exam Triage Vital Signs ED Triage Vitals  Enc Vitals Group     BP 07/25/22 1012 (!) 192/134     Pulse Rate 07/25/22 1010 (!) 106     Resp 07/25/22 1010 18     Temp 07/25/22 1010 98 F (36.7 C)     Temp src --      SpO2 07/25/22 1010 97 %     Weight --      Height --  Head Circumference --      Peak Flow --      Pain Score 07/25/22 1009 2     Pain Loc --      Pain Edu? --      Excl. in [place]? --    No data found.  Updated Vital Signs BP (!) 187/120   Pulse (!) 106   Temp 98 F (36.7 C)   Resp 18   SpO2 97%   Visual Acuity Right Eye Distance:   Left Eye Distance:   Bilateral Distance:    Right Eye Near:   Left Eye Near:    Bilateral Near:     Physical Exam Constitutional:      General: She is not in acute distress.    Appearance: Normal appearance. She is not toxic-appearing or diaphoretic.  HENT:     Head: Normocephalic and atraumatic.     Right Ear: Ear canal normal. No drainage, swelling or tenderness. A middle ear effusion is present. There is impacted cerumen. No foreign body. No mastoid tenderness. Tympanic membrane is not perforated, erythematous or bulging.     Left Ear: Ear canal normal. No drainage, swelling or tenderness. A middle ear effusion is present. There is impacted cerumen. No foreign body. No mastoid tenderness. Tympanic membrane is erythematous. Tympanic membrane is not perforated or bulging.     Ears:     Comments: Patient had bilateral impacted cerumen on first physical exam.  Ears were irrigated bilaterally.  On second physical exam, right ear appears to have middle ear effusion with intact TM and no discoloration.  Left ear appears to be slightly erythematous with mild middle ear effusion.  Both TMs appear intact.  Both external canals appear normal.  Possible scarring to left TM but unclear physical exam.    Nose: No congestion.     Mouth/Throat:     Mouth: Mucous membranes are moist.      Pharynx: No posterior oropharyngeal erythema.  Eyes:     Extraocular Movements: Extraocular movements intact.     Conjunctiva/sclera: Conjunctivae normal.     Pupils: Pupils are equal, round, and reactive to light.  Cardiovascular:     Rate and Rhythm: Regular rhythm. Tachycardia present.     Pulses: Normal pulses.     Heart sounds: Normal heart sounds.     Comments: Mild tachycardia noted.  Pulmonary:     Effort: Pulmonary effort is normal. No respiratory distress.     Breath sounds: Normal breath sounds. No stridor. No wheezing, rhonchi or rales.  Abdominal:     General: Abdomen is flat. Bowel sounds are normal.     Palpations: Abdomen is soft.  Musculoskeletal:        General: Normal range of motion.     Cervical back: Normal range of motion.  Skin:    General: Skin is warm and dry.  Neurological:     General: No focal deficit present.     Mental Status: She is alert and oriented to person, place, and time. Mental status is at baseline.     Cranial Nerves: Cranial nerves 2-12 are intact.     Sensory: Sensation is intact.     Motor: Motor function is intact.     Coordination: Coordination is intact.     Gait: Gait is intact.  Psychiatric:        Mood and Affect: Mood normal.        Behavior: Behavior normal.      UC Treatments /  Results  Labs (all labs ordered are listed, but only abnormal results are displayed) Labs Reviewed  CBC  COMPREHENSIVE METABOLIC PANEL  TSH    EKG   Radiology DG Chest 2 View  Result Date: 07/25/2022 CLINICAL DATA:  41 year old female with history of persistent cough. EXAM: CHEST - 2 VIEW COMPARISON:  Chest x-ray 11/04/2007. FINDINGS: Lung volumes are normal. No consolidative airspace disease. No pleural effusions. No pneumothorax. No pulmonary nodule or mass noted. Pulmonary vasculature and the cardiomediastinal silhouette are within normal limits. IMPRESSION: No radiographic evidence of acute cardiopulmonary disease. Electronically  Signed   By: Vinnie Langton M.D.   On: 07/25/2022 10:43    Procedures Procedures (including critical care time)  Medications Ordered in UC Medications - No data to display  Initial Impression / Assessment and Plan / UC Course  I have reviewed the triage vital signs and the nursing notes.  Pertinent labs & imaging results that were available during my care of the patient were reviewed by me and considered in my medical decision making (see chart for details).     Patient has persistent cough associated with recently testing positive for RSV infection.  Chest x-ray was completed that was negative for any acute cardiopulmonary process.  Although, I do suspect possible viral bronchitis associated with RSV.  Therefore, I do think the patient would benefit from a low-dose and short course of prednisone to help alleviate this.  Benzonatate prescribed to take as needed for cough as well as it appears that she was prescribed guaifenesin and Promethazine DM at previous urgent care visit.  Patient does have diabetes and takes Ozempic, although latest hemoglobin A1c was unremarkable so do think this should be safe.  Although, patient was advised to monitor blood glucose very closely while on prednisone with her home glucose monitor and to stop taking if it becomes elevated.  Patient had bilateral impacted cerumen on first physical exam.  Ears were irrigated bilaterally successfully.  On second physical exam, right ear appears to have middle ear effusion left ear appears slightly erythematous which is concerning for left otitis media.  Will treat with Augmentin.  Prednisone should be helpful with middle ear effusion as well.  Given possible scarring/unclear etiology/appearance of left ear, encouraged follow-up with ENT specialty at provided contact information as well.  Patient has significantly elevated blood pressure reading with recheck being similar.  Patient reports increased stress this morning so this  could be contributing but there is still concerning.  Patient is completely asymptomatic and neuro exam is normal so do not think that emergent evaluation is necessary.  Will obtain CMP, CBC, TSH.  EKG was completed given mildly elevated heart rate to 115.  It showed sinus tachycardia but no obvious acute abnormality that needs emergent evaluation.  Patient currently states that she takes half a pill of her blood pressure medication which is losartan HCTZ 50 mg 12.5 mg.  She was advised given the current blood pressure reading to increase the dose to losartan 100 mg HCTZ 25 mg which would be 1 full pill.  She was encouraged to monitor blood pressure very closely at home with home blood pressure cuff and follow-up with PCP sooner if possible.  She was encouraged urgent care and ED precautions as well.  Discussed Strict return and ER precautions.  Patient verbalized understanding and was agreeable with plan.  Coding this is a level 4 given multiple chief complaints and chronic illness addressed. Final Clinical Impressions(s) / UC Diagnoses  Final diagnoses:  Persistent cough  RSV infection  Other non-recurrent acute nonsuppurative otitis media of left ear  Fluid level behind tympanic membrane of right ear  Bilateral impacted cerumen  Sinus tachycardia  Essential hypertension     Discharge Instructions      Your chest x-ray was normal.  I have prescribed you a low-dose and short course of prednisone to help alleviate inflammation in chest and cough.  I have also prescribed you a cough medication that will be safe with your blood pressure.  Your ears have been washed out.  You have fluid behind your right eardrum as well as suspicion of the left ear infection so I am treating with Augmentin antibiotic.  The prednisone should help with the fluid in the ear as well.  I would recommend that you see an ear, nose, throat specialist given the appearance of your left ear on exam so I provided this contact  information for you to follow-up.  Monitor your blood sugar very closely while on prednisone and stop taking if it becomes elevated.  Blood pressure is significantly elevated and your heart rate is mildly elevated as well.  Your EKG was unremarkable.  I recommend that you monitor your blood pressure very closely at home and increase your dose of losartan HCTZ to 100 mg-25 mg.  Please follow-up sooner with your primary care doctor or urgent care if it remains elevated.    ED Prescriptions     Medication Sig Dispense Auth. Provider   amoxicillin-clavulanate (AUGMENTIN) 875-125 MG tablet Take 1 tablet by mouth every 12 (twelve) hours. 14 tablet [place], [place] E, [place]   predniSONE (DELTASONE) 20 MG tablet Take 1 tablet (20 mg total) by mouth daily for 5 days. 5 tablet [place], [place] E, [place]   benzonatate (TESSALON) 100 MG capsule Take 1 capsule (100 mg total) by mouth every 8 (eight) hours as needed for cough. 21 capsule [place], Michele Rockers, [place]      PDMP not reviewed this encounter.   Teodora Medici, [place] 07/25/22 1122

## 2022-07-25 NOTE — Discharge Instructions (Signed)
Your chest x-ray was normal.  I have prescribed you a low-dose and short course of prednisone to help alleviate inflammation in chest and cough.  I have also prescribed you a cough medication that will be safe with your blood pressure.  Your ears have been washed out.  You have fluid behind your right eardrum as well as suspicion of the left ear infection so I am treating with Augmentin antibiotic.  The prednisone should help with the fluid in the ear as well.  I would recommend that you see an ear, nose, throat specialist given the appearance of your left ear on exam so I provided this contact information for you to follow-up.  Monitor your blood sugar very closely while on prednisone and stop taking if it becomes elevated.  Blood pressure is significantly elevated and your heart rate is mildly elevated as well.  Your EKG was unremarkable.  I recommend that you monitor your blood pressure very closely at home and increase your dose of losartan HCTZ to 100 mg-25 mg.  Please follow-up sooner with your primary care doctor or urgent care if it remains elevated.

## 2022-07-25 NOTE — ED Triage Notes (Signed)
Pt is present today with ringing in the left ear and productive x2 weeks.

## 2022-07-26 LAB — CBC
Hematocrit: 41.9 % (ref 34.0–46.6)
Hemoglobin: 13.8 g/dL (ref 11.1–15.9)
MCH: 27.9 pg (ref 26.6–33.0)
MCHC: 32.9 g/dL (ref 31.5–35.7)
MCV: 85 fL (ref 79–97)
Platelets: 266 10*3/uL (ref 150–450)
RBC: 4.95 x10E6/uL (ref 3.77–5.28)
RDW: 12.9 % (ref 11.7–15.4)
WBC: 7.9 10*3/uL (ref 3.4–10.8)

## 2022-07-26 LAB — COMPREHENSIVE METABOLIC PANEL
ALT: 20 IU/L (ref 0–32)
AST: 22 IU/L (ref 0–40)
Albumin/Globulin Ratio: 1.8 (ref 1.2–2.2)
Albumin: 4.3 g/dL (ref 3.9–4.9)
Alkaline Phosphatase: 59 IU/L (ref 44–121)
BUN/Creatinine Ratio: 15 (ref 9–23)
BUN: 11 mg/dL (ref 6–24)
Bilirubin Total: 0.3 mg/dL (ref 0.0–1.2)
CO2: 24 mmol/L (ref 20–29)
Calcium: 9.6 mg/dL (ref 8.7–10.2)
Chloride: 102 mmol/L (ref 96–106)
Creatinine, Ser: 0.71 mg/dL (ref 0.57–1.00)
Globulin, Total: 2.4 g/dL (ref 1.5–4.5)
Glucose: 102 mg/dL — ABNORMAL HIGH (ref 70–99)
Potassium: 4.8 mmol/L (ref 3.5–5.2)
Sodium: 140 mmol/L (ref 134–144)
Total Protein: 6.7 g/dL (ref 6.0–8.5)
eGFR: 108 mL/min/{1.73_m2} (ref >59)

## 2022-07-26 LAB — TSH: TSH: 1.91 u[IU]/mL (ref 0.450–4.500)

## 2022-08-06 ENCOUNTER — Encounter: Payer: Self-pay | Admitting: *Deleted

## 2022-08-11 ENCOUNTER — Ambulatory Visit: Payer: 59 | Admitting: Physician Assistant

## 2022-08-12 ENCOUNTER — Encounter: Payer: Self-pay | Admitting: Physician Assistant

## 2022-08-12 ENCOUNTER — Ambulatory Visit (INDEPENDENT_AMBULATORY_CARE_PROVIDER_SITE_OTHER): Payer: 59 | Admitting: Physician Assistant

## 2022-08-12 VITALS — BP 140/96 | HR 112 | Temp 98.0°F | Ht 64.0 in | Wt 287.0 lb

## 2022-08-12 DIAGNOSIS — Z6841 Body Mass Index (BMI) 40.0 and over, adult: Secondary | ICD-10-CM

## 2022-08-12 DIAGNOSIS — I152 Hypertension secondary to endocrine disorders: Secondary | ICD-10-CM

## 2022-08-12 DIAGNOSIS — E119 Type 2 diabetes mellitus without complications: Secondary | ICD-10-CM

## 2022-08-12 DIAGNOSIS — E1159 Type 2 diabetes mellitus with other circulatory complications: Secondary | ICD-10-CM

## 2022-08-12 DIAGNOSIS — B338 Other specified viral diseases: Secondary | ICD-10-CM | POA: Diagnosis not present

## 2022-08-12 LAB — POCT GLYCOSYLATED HEMOGLOBIN (HGB A1C): Hemoglobin A1C: 5.8 % — AB (ref 4.0–5.6)

## 2022-08-12 MED ORDER — OZEMPIC (2 MG/DOSE) 8 MG/3ML ~~LOC~~ SOPN: PEN_INJECTOR | SUBCUTANEOUS | 2 refills | Status: DC

## 2022-08-12 MED ORDER — LOSARTAN POTASSIUM-HCTZ 100-25 MG PO TABS: 1.0000 | ORAL_TABLET | Freq: Every day | ORAL | 1 refills | Status: DC

## 2022-08-12 NOTE — Progress Notes (Signed)
Subjective:    Patient ID: Anita Webb, female    DOB: Apr 11, 1981, 41 y.o.   MRN: [id number]  Chief Complaint  Patient presents with   Medication Refill    Pt had RSV around thanksgiving, pt states she went to urgent care and bp was high, bp is still high    HPI Patient is in today for med recheck.   BP has been trending high. Hyzaar 100-25 mg daily.  Has been taking Dayquil OTC for about 4 weeks straight since having RSV illness, still at least once daily.   Previously on Ozempic 2 mg/ week. Off x 2 months now because no pharmacies had it in stock.  No headache or dizziness. No vision changes. No CP or SOB.   Past Medical History:  Diagnosis Date   B12 deficiency    Depression    Diabetes mellitus without complication ([place])    Phreesia 09/30/2020   Fatty liver    Hypertension    Joint pain    PCOS (polycystic ovarian syndrome)    Vitamin D deficiency     Past Surgical History:  Procedure Laterality Date   CESAREAN SECTION  02/2007   FOOT SURGERY  2000   bone removed from left heel     Family History  Problem Relation Age of Onset   Hyperlipidemia Mother    Hypertension Mother    Dementia Mother    Thyroid disease Mother    Depression Mother    Obesity Mother    Hyperlipidemia Father    Hypertension Father    Cancer Maternal Grandmother        breast CA   Diabetes Maternal Grandmother    Breast cancer Maternal Grandmother 1   Hypertension Maternal Grandfather    Heart disease Maternal Grandfather    Hyperlipidemia Maternal Grandfather    Heart disease Paternal Grandmother    Hypertension Paternal Grandmother     Social History   Tobacco Use   Smoking status: Never   Smokeless tobacco: Never  Vaping Use   Vaping Use: Never used  Substance Use Topics   Alcohol use: Yes    Comment: 1-2 drink per week   Drug use: No     Allergies  Allergen Reactions   Erythromycin     GI upset   Trulicity [Dulaglutide] Rash    Review of  Systems NEGATIVE UNLESS OTHERWISE INDICATED IN HPI      Objective:     BP (!) 140/96 (BP Location: Right Arm)   Pulse (!) 112   Temp 98 F (36.7 C) (Temporal)   Ht 5\' 4"  (1.626 m)   Wt 287 lb (130.2 kg)   LMP  (LMP Unknown) Comment: Pt states hasnt had cycle in about over  mos  SpO2 98%   BMI 49.26 kg/m   Wt Readings from Last 3 Encounters:  08/12/22 287 lb (130.2 kg)  01/27/22 246 lb 9.6 oz (111.9 kg)  01/06/22 234 lb (106.1 kg)    BP Readings from Last 3 Encounters:  08/12/22 (!) 140/96  07/25/22 (!) 187/120  01/27/22 (!) 158/102     Physical Exam Vitals and nursing note reviewed.  Constitutional:      Appearance: Normal appearance. She is obese.  Cardiovascular:     Rate and Rhythm: Normal rate and regular rhythm.     Pulses: Normal pulses.     Heart sounds: No murmur heard. Pulmonary:     Effort: Pulmonary effort is normal.     Breath sounds:  Normal breath sounds.  Neurological:     General: No focal deficit present.     Mental Status: She is alert and oriented to person, place, and time.  Psychiatric:        Mood and Affect: Mood normal.        Behavior: Behavior normal.        Assessment & Plan:  Type 2 diabetes mellitus without complication, without long-term current use of insulin (HCC) Assessment & Plan: Lab Results  Component Value Date   HGBA1C 5.8 (A) 08/12/2022   Refilled Ozempic 2 mg once weekly Keep up good work on lifestyle changes  Orders: -     Ozempic (2 MG/DOSE); INJECT 2 MG SUBCUTANEOUSLY ONCE WEEKLY AS DIRECTED  Dispense: 3 mL; Refill: 2 -     POCT glycosylated hemoglobin (Hb A1C)  Hypertension associated with type 2 diabetes mellitus (HCC) Assessment & Plan: Elevated, currently on Hyzaar 100 - 25 mg daily.  Has been on OTC medications for weeks for RSV URI. Stop these OTC medications. Cont current dose. Drink 80-100 oz water daily. Avoid salted / processed foods. Recheck in 2 weeks. Adjust medication at that time if needed.     RSV (respiratory syncytial virus infection)  BMI 45.0-49.9, adult (HCC)  Other orders -     Losartan Potassium-HCTZ; Take 1 tablet by mouth daily.  Dispense: 90 tablet; Refill: 1   - Recovering from RSV, slight lingering cough, but continuing to improve      Return in about 2 weeks (around 08/26/2022) for BP recheck .  This note was prepared with assistance of Conservation officer, historic buildings. Occasional wrong-word or sound-a-like substitutions may have occurred due to the inherent limitations of voice recognition software.      Meranda Dechaine M Walterine Amodei, PA-C

## 2022-08-17 NOTE — Assessment & Plan Note (Signed)
Elevated, currently on Hyzaar 100 - 25 mg daily.  Has been on OTC medications for weeks for RSV URI. Stop these OTC medications. Cont current dose. Drink 80-100 oz water daily. Avoid salted / processed foods. Recheck in 2 weeks. Adjust medication at that time if needed.

## 2022-08-17 NOTE — Assessment & Plan Note (Signed)
Lab Results  Component Value Date   HGBA1C 5.8 (A) 08/12/2022   Refilled Ozempic 2 mg once weekly Keep up good work on lifestyle changes

## 2022-08-27 ENCOUNTER — Ambulatory Visit: Payer: 59 | Admitting: Physician Assistant

## 2022-09-07 ENCOUNTER — Encounter: Payer: Self-pay | Admitting: Physician Assistant

## 2022-09-07 ENCOUNTER — Ambulatory Visit (INDEPENDENT_AMBULATORY_CARE_PROVIDER_SITE_OTHER): Payer: 59 | Admitting: Physician Assistant

## 2022-09-07 VITALS — BP 124/80 | HR 82 | Temp 97.7°F | Ht 64.0 in | Wt 283.8 lb

## 2022-09-07 DIAGNOSIS — Z23 Encounter for immunization: Secondary | ICD-10-CM

## 2022-09-07 DIAGNOSIS — E1159 Type 2 diabetes mellitus with other circulatory complications: Secondary | ICD-10-CM | POA: Diagnosis not present

## 2022-09-07 DIAGNOSIS — I152 Hypertension secondary to endocrine disorders: Secondary | ICD-10-CM | POA: Diagnosis not present

## 2022-09-07 NOTE — Progress Notes (Signed)
Subjective:    Patient ID: Anita Webb, female    DOB: [birth year], 42 y.o.   MRN: [id number]  Chief Complaint  Patient presents with   Hypertension    Pt back in office for BP recheck; pt says all is well no concerns; pt received flu vaccine today    Hypertension   Patient is in today for blood pressure recheck. She hasn't been checking at home. Feeling well though, no longer taking OTC could medications. Still taking Hyzaar as directed. Only child, taking care of mom with end-stage AD, her father, and also her children. Sees counselor regularly.  , Past Medical History:  Diagnosis Date   B12 deficiency    Depression    Diabetes mellitus without complication ([place])    Phreesia 09/30/2020   Fatty liver    Hypertension    Joint pain    PCOS (polycystic ovarian syndrome)    Vitamin D deficiency     Past Surgical History:  Procedure Laterality Date   CESAREAN SECTION  02/2007   FOOT SURGERY  2000   bone removed from left heel     Family History  Problem Relation Age of Onset   Hyperlipidemia Mother    Hypertension Mother    Dementia Mother    Thyroid disease Mother    Depression Mother    Obesity Mother    Hyperlipidemia Father    Hypertension Father    Cancer Maternal Grandmother        breast CA   Diabetes Maternal Grandmother    Breast cancer Maternal Grandmother 64   Hypertension Maternal Grandfather    Heart disease Maternal Grandfather    Hyperlipidemia Maternal Grandfather    Heart disease Paternal Grandmother    Hypertension Paternal Grandmother     Social History   Tobacco Use   Smoking status: Never   Smokeless tobacco: Never  Vaping Use   Vaping Use: Never used  Substance Use Topics   Alcohol use: Yes    Comment: 1-2 drink per week   Drug use: No     Allergies  Allergen Reactions   Erythromycin     GI upset   Trulicity [Dulaglutide] Rash    Review of Systems NEGATIVE UNLESS OTHERWISE INDICATED IN HPI      Objective:     BP  124/80 (BP Location: Left Arm)   Pulse 82   Temp 97.7 F (36.5 C) (Temporal)   Ht 5\' 4"  (1.626 m)   Wt 283 lb 12.8 oz (128.7 kg)   LMP  (LMP Unknown) Comment: Pt states hasnt had cycle in about over  mos  SpO2 98%   BMI 48.71 kg/m   Wt Readings from Last 3 Encounters:  09/07/22 283 lb 12.8 oz (128.7 kg)  08/12/22 287 lb (130.2 kg)  01/27/22 246 lb 9.6 oz (111.9 kg)    BP Readings from Last 3 Encounters:  09/07/22 124/80  08/12/22 (!) 140/96  07/25/22 (!) 187/120     Physical Exam Vitals and nursing note reviewed.  Constitutional:      Appearance: Normal appearance. She is obese.  Cardiovascular:     Rate and Rhythm: Normal rate and regular rhythm.     Pulses: Normal pulses.     Heart sounds: No murmur heard. Pulmonary:     Effort: Pulmonary effort is normal.     Breath sounds: Normal breath sounds.  Neurological:     General: No focal deficit present.     Mental Status: She is alert  and oriented to person, place, and time.  Psychiatric:        Mood and Affect: Mood normal.        Assessment & Plan:  Hypertension associated with type 2 diabetes mellitus (HCC) Assessment & Plan: Stable, normotensive, cont Hyzaar 100-25 mg daily. Monitor at home. Keep working on lifestyle.    Need for immunization against influenza -     Flu Vaccine QUAD 50mo+IM (Fluarix, Fluzone & Alfiuria Quad PF)       Return in about 6 months (around 03/08/2023) for recheck meds / fasting labs .    Aarika Moon M Revis Whalin, PA-C

## 2022-09-07 NOTE — Assessment & Plan Note (Signed)
Stable, normotensive, cont Hyzaar 100-25 mg daily. Monitor at home. Keep working on lifestyle.

## 2022-10-27 ENCOUNTER — Ambulatory Visit (INDEPENDENT_AMBULATORY_CARE_PROVIDER_SITE_OTHER): Payer: 59

## 2022-10-27 ENCOUNTER — Encounter (HOSPITAL_COMMUNITY): Payer: Self-pay

## 2022-10-27 ENCOUNTER — Ambulatory Visit (HOSPITAL_COMMUNITY)
Admission: EM | Admit: 2022-10-27 | Discharge: 2022-10-27 | Disposition: A | Discharge status: Home / Self Care | Payer: 59 | Attending: Emergency Medicine | Admitting: Emergency Medicine

## 2022-10-27 DIAGNOSIS — M79672 Pain in left foot: Secondary | ICD-10-CM

## 2022-10-27 DIAGNOSIS — M79675 Pain in left toe(s): Secondary | ICD-10-CM

## 2022-10-27 LAB — URIC ACID: Uric Acid, Serum: 5.1 mg/dL (ref 2.5–7.1)

## 2022-10-27 MED ORDER — INDOMETHACIN 50 MG PO CAPS: 50.0000 mg | ORAL_CAPSULE | Freq: Three times a day (TID) | ORAL | 0 refills | Status: AC

## 2022-10-27 NOTE — ED Triage Notes (Signed)
Pt is here for pain in the left foot and toe x 1day

## 2022-10-27 NOTE — Discharge Instructions (Signed)
Your x-ray did not show any acute findings that would explain the pain at the base of your left toe at this time.  However, per my personal read, I do believe that the cause of your pain is related to inflammation of an extra bone in that same location called an accessory bone.  This inflammation is likely causing you to have significant inflammation of the tendon in that area.  The presence of the accessory bone may or may not be related to your history of bone spurs, accessory bones are a common finding in this patient's.  To rule out gout, we performed a lab that we will measure your uric acid level.  If that level comes back elevated, you will be provided with further treatment if needed.  In the meantime, recommend that you begin taking indomethacin which is a very powerful nonsteroidal anti-inflammatory pain medication that will not only address the pain from gout but can also address severe inflammatory pain caused by acute tendinitis.  You may take this medication 3 times daily as needed and discontinue when it is no longer needed.  Also recommend that you apply ice to the affected area of your left foot for 20 minutes 3-4 times daily to assist with reducing inflammation and pain.  If possible, please avoid walking is much as possible for the next 3 to 4 days as well.  If these conservative measures do not provide any relief your symptoms in the next few days and your uric acid level is normal, recommend that you follow-up with your orthopedic foot specialist or podiatrist for further evaluation and treatment.  Thank you for visiting urgent care today.

## 2022-10-27 NOTE — ED Provider Notes (Signed)
[place]    CSN: [id number] Arrival date & time: 10/27/22  [id number]    HISTORY   Chief Complaint  Patient presents with   Foot Pain   HPI Anita Webb is a pleasant, 42 y.o. female who presents to urgent care today. Patient complains of a 1 day history of progressively worsening pain at the base of her left great toe and the arch of her left foot.  Patient denies heel pain, Achilles tendon pain, ankle pain.  Patient states she has noticed that the top of her left foot has been more swollen than usual which causes intermittent episodes of numbness and tingling.  Patient states she took some ibuprofen this morning for pain which did not help very much.  Patient reports a history of having had surgery to remove bone spurs from the bottom of her foot, states pain today is similar.  Patient denies known acute injury to her left foot or recent stumble/fall.  Patient denies history of gout but states that her grandmother suffered from gout.  The history is provided by the patient.   Past Medical History:  Diagnosis Date   B12 deficiency    Depression    Diabetes mellitus without complication ([place])    Phreesia 09/30/2020   Fatty liver    Hypertension    Joint pain    PCOS (polycystic ovarian syndrome)    Vitamin D deficiency    Patient Active Problem List   Diagnosis Date Noted   Anxiety 06/19/2021   GAD (generalized anxiety disorder) 12/11/2020   Class 3 severe obesity with serious comorbidity and body mass index (BMI) of 50.0 to 59.9 in adult ([place]) 12/11/2020   Encounter for IUD insertion 09/30/2020   Hyperlipidemia associated with type 2 diabetes mellitus ([place]) 09/12/2020   Diabetes mellitus ([place]) 09/12/2020   Hypertension associated with type 2 diabetes mellitus ([place]) 09/12/2020   At risk for heart disease 09/12/2020   Other fatigue 08/29/2020   SOBOE (shortness of breath on exertion) 123456   Nonalcoholic hepatosteatosis 123456   PCOS (polycystic ovarian  syndrome) 08/29/2020   B12 deficiency 08/29/2020   At risk for impaired metabolic function 123456   Depression 08/29/2020   Retroverted uterus 11/07/2019   Elevated LFTs 10/31/2018   Hepatic steatosis 10/31/2018   Primary insomnia 06/25/2018   Hirsutism 06/25/2018   Amenorrhea 06/25/2018   Essential hypertension 06/25/2018   Insulin resistance 06/25/2018   Pure hypercholesterolemia 06/25/2018   Vitamin D deficiency 06/25/2018   Allergic rhinitis 02/22/2015   Morbid obesity ([place]) 02/22/2015   Past Surgical History:  Procedure Laterality Date   CESAREAN SECTION  02/2007   FOOT SURGERY  2000   bone removed from left heel    OB History     Gravida  2   Para  2   Term      Preterm      AB      Living         SAB      IAB      Ectopic      Multiple      Live Births             Home Medications    Prior to Admission medications   Medication Sig Start Date End Date Taking? Authorizing Provider  losartan-hydrochlorothiazide (HYZAAR) 100-25 MG tablet Take 1 tablet by mouth daily. 08/12/22  Yes Allwardt, Alyssa M, PA-C  Semaglutide, 2 MG/DOSE, (OZEMPIC, 2 MG/DOSE,) 8 MG/3ML SOPN INJECT 2 MG  SUBCUTANEOUSLY ONCE WEEKLY AS DIRECTED 08/12/22  Yes Allwardt, Alyssa M, PA-C  Vitamin D, Ergocalciferol, (DRISDOL) 1.25 MG (50000 UNIT) CAPS capsule Take 1 capsule (50,000 Units total) by mouth every 3 (three) days. 02/18/22  Yes Allwardt, Alyssa M, PA-C  benzonatate (TESSALON) 100 MG capsule Take 1 capsule (100 mg total) by mouth every 8 (eight) hours as needed for cough. 07/25/22   Teodora Medici, FNP  betamethasone dipropionate 0.05 % cream Apply topically 2 (two) times daily. 01/27/22   Allwardt, Randa Evens, PA-C  Blood Glucose Monitoring Suppl (TRUE METRIX METER) w/Device KIT Use as directed 10/03/20   Camillia Herter, NP  glucose blood (TRUE METRIX BLOOD GLUCOSE TEST) test strip Use as instructed 10/03/20   Camillia Herter, NP  levonorgestrel (LILETTA) 19.5 MCG/DAY IUD IUD 1  each by Intrauterine route once.    [provider]  TRUEplus Lancets 28G MISC Use as directed 10/03/20   Camillia Herter, NP    Family History Family History  Problem Relation Age of Onset   Hyperlipidemia Mother    Hypertension Mother    Dementia Mother    Thyroid disease Mother    Depression Mother    Obesity Mother    Hyperlipidemia Father    Hypertension Father    Cancer Maternal Grandmother        breast CA   Diabetes Maternal Grandmother    Breast cancer Maternal Grandmother 77   Hypertension Maternal Grandfather    Heart disease Maternal Grandfather    Hyperlipidemia Maternal Grandfather    Heart disease Paternal Grandmother    Hypertension Paternal Grandmother    Social History Social History   Tobacco Use   Smoking status: Never   Smokeless tobacco: Never  Vaping Use   Vaping Use: Never used  Substance Use Topics   Alcohol use: Yes    Comment: 1-2 drink per week   Drug use: No   Allergies   Erythromycin and Trulicity [dulaglutide]  Review of Systems Review of Systems Pertinent findings revealed after performing a 14 point review of systems has been noted in the history of present illness.  Physical Exam Vital Signs BP 129/82 (BP Location: Left Arm)   Pulse 92   Temp 98 F (36.7 C) (Oral)   Resp 12   LMP  (LMP Unknown)   SpO2 99%   No data found.  Physical Exam Vitals and nursing note reviewed.  Constitutional:      General: She is not in acute distress.    Appearance: Normal appearance. She is obese. She is not toxic-appearing.  HENT:     Head: Normocephalic and atraumatic.  Eyes:     Pupils: Pupils are equal, round, and reactive to light.  Cardiovascular:     Rate and Rhythm: Normal rate and regular rhythm.  Pulmonary:     Effort: Pulmonary effort is normal.     Breath sounds: Normal breath sounds.  Musculoskeletal:     Cervical back: Normal range of motion and neck supple.     Left foot: Decreased range of motion (Pain with  flexion of great toe). Normal capillary refill. Swelling and tenderness (TTP at first tarsometatarsal joint, there is also warmth and erythema.) present. No deformity, bunion, Charcot foot, foot drop, prominent metatarsal heads, laceration, bony tenderness or crepitus. Normal pulse.  Skin:    General: Skin is warm and dry.  Neurological:     General: No focal deficit present.     Mental Status: She is alert and  oriented to person, place, and time. Mental status is at baseline.  Psychiatric:        Mood and Affect: Mood normal.        Behavior: Behavior normal.        Thought Content: Thought content normal.        Judgment: Judgment normal.     UC Couse / Diagnostics / Procedures:     Radiology DG Foot Complete Left  Result Date: 10/27/2022 CLINICAL DATA:  hx bone spurs, TTP and pain with WB at first tarsometatarsal jt, left foot pain for 1 day EXAM: LEFT FOOT - COMPLETE 3+ VIEW COMPARISON:  None Available. FINDINGS: Frontal, oblique, and lateral views of the left foot are obtained. No fracture, subluxation, or dislocation. Joint spaces are well preserved. Soft tissues are unremarkable. IMPRESSION: 1. Unremarkable left foot. Electronically Signed   By: Randa Ngo M.D.   On: 10/27/2022 11:41    Procedures Procedures (including critical care time) EKG  Pending results:  Labs Reviewed  URIC ACID    Medications Ordered in UC: Medications - No data to display  UC Diagnoses / Final Clinical Impressions(s)   I have reviewed the triage vital signs and the nursing notes.  Pertinent labs & imaging results that were available during my care of the patient were reviewed by me and considered in my medical decision making (see chart for details).    Final diagnoses:  Foot pain, left  Great toe pain, left   Patient advised of x-ray findings as well as my personal interpretation of her x-ray.  Recommend indomethacin for relief of acute inflammatory pain.  Patient is a type II diabetic  so we will avoid steroids for anti-inflammatory treatment.  Conservative measures recommended.  Will notify patient of uric acid level once complete.  Patient advised to follow-up with PCP and/or podiatrist as needed.  Please see discharge instructions below for details of plan of care as provided to patient. ED Prescriptions     Medication Sig Dispense Auth. Provider   indomethacin (INDOCIN) 50 MG capsule Take 1 capsule (50 mg total) by mouth 3 (three) times daily with meals for 14 days. 42 capsule Lynden Oxford Scales, PA-C      PDMP not reviewed this encounter.  Discharge Instructions:   Discharge Instructions      Your x-ray did not show any acute findings that would explain the pain at the base of your left toe at this time.  However, per my personal read, I do believe that the cause of your pain is related to inflammation of an extra bone in that same location called an accessory bone.  This inflammation is likely causing you to have significant inflammation of the tendon in that area.  The presence of the accessory bone may or may not be related to your history of bone spurs, accessory bones are a common finding in this patient's.  To rule out gout, we performed a lab that we will measure your uric acid level.  If that level comes back elevated, you will be provided with further treatment if needed.  In the meantime, recommend that you begin taking indomethacin which is a very powerful nonsteroidal anti-inflammatory pain medication that will not only address the pain from gout but can also address severe inflammatory pain caused by acute tendinitis.  You may take this medication 3 times daily as needed and discontinue when it is no longer needed.  Also recommend that you apply ice to the affected area of  your left foot for 20 minutes 3-4 times daily to assist with reducing inflammation and pain.  If possible, please avoid walking is much as possible for the next 3 to 4 days as  well.  If these conservative measures do not provide any relief your symptoms in the next few days and your uric acid level is normal, recommend that you follow-up with your orthopedic foot specialist or podiatrist for further evaluation and treatment.  Thank you for visiting urgent care today.        Disposition Upon Discharge:  Condition: stable for discharge home Home: take medications as prescribed; routine discharge instructions as discussed; follow up as advised.  Patient presented with an acute illness with associated systemic symptoms and significant discomfort requiring urgent management. In my opinion, this is a condition that a prudent lay person (someone who possesses an average knowledge of health and medicine) may potentially expect to result in complications if not addressed urgently such as respiratory distress, impairment of bodily function or dysfunction of bodily organs.   Routine symptom specific, illness specific and/or disease specific instructions were discussed with the patient and/or caregiver at length.   As such, the patient has been evaluated and assessed, work-up was performed and treatment was provided in alignment with urgent care protocols and evidence based medicine.  Patient/parent/caregiver has been advised that the patient may require follow up for further testing and treatment if the symptoms continue in spite of treatment, as clinically indicated and appropriate.  Patient/parent/caregiver has been advised to report to orthopedic urgent care clinic or return to the [facility] or PCP in 3-5 days if no better; follow-up with orthopedics, PCP or the Emergency Department if new signs and symptoms develop or if the current signs or symptoms continue to change or worsen for further workup, evaluation and treatment as clinically indicated and appropriate  The patient will follow up with their current PCP if and as advised. If the patient does not currently have a PCP we  will have assisted them in obtaining one.   The patient may need specialty follow up if the symptoms continue, in spite of conservative treatment and management, for further workup, evaluation, consultation and treatment as clinically indicated and appropriate.  Patient/parent/caregiver verbalized understanding and agreement of plan as discussed.  All questions were addressed during visit.  Please see discharge instructions below for further details of plan.  This office note has been dictated using Museum/gallery curator.  Unfortunately, this method of dictation can sometimes lead to typographical or grammatical errors.  I apologize for your inconvenience in advance if this occurs.  Please do not hesitate to reach out to me if clarification is needed.      Lynden Oxford Scales, [place] 10/27/22 1202

## 2022-11-01 ENCOUNTER — Other Ambulatory Visit: Payer: Self-pay | Admitting: Physician Assistant

## 2022-11-01 DIAGNOSIS — E119 Type 2 diabetes mellitus without complications: Secondary | ICD-10-CM

## 2022-11-26 ENCOUNTER — Other Ambulatory Visit: Payer: Self-pay | Admitting: Physician Assistant

## 2022-11-26 DIAGNOSIS — E119 Type 2 diabetes mellitus without complications: Secondary | ICD-10-CM

## 2022-12-28 ENCOUNTER — Other Ambulatory Visit: Payer: Self-pay | Admitting: Physician Assistant

## 2022-12-28 DIAGNOSIS — E119 Type 2 diabetes mellitus without complications: Secondary | ICD-10-CM

## 2023-01-25 ENCOUNTER — Other Ambulatory Visit: Payer: Self-pay | Admitting: Physician Assistant

## 2023-01-25 DIAGNOSIS — E119 Type 2 diabetes mellitus without complications: Secondary | ICD-10-CM

## 2023-01-26 ENCOUNTER — Other Ambulatory Visit: Payer: Self-pay | Admitting: Physician Assistant

## 2023-01-28 ENCOUNTER — Other Ambulatory Visit: Payer: Self-pay | Admitting: Obstetrics and Gynecology

## 2023-01-28 DIAGNOSIS — N6489 Other specified disorders of breast: Secondary | ICD-10-CM

## 2023-02-19 ENCOUNTER — Other Ambulatory Visit: Payer: Self-pay | Admitting: Physician Assistant

## 2023-02-19 DIAGNOSIS — E119 Type 2 diabetes mellitus without complications: Secondary | ICD-10-CM

## 2023-03-22 ENCOUNTER — Other Ambulatory Visit: Payer: Self-pay | Admitting: Physician Assistant

## 2023-03-22 DIAGNOSIS — E119 Type 2 diabetes mellitus without complications: Secondary | ICD-10-CM

## 2023-03-25 ENCOUNTER — Other Ambulatory Visit: Payer: Self-pay | Admitting: Physician Assistant

## 2023-03-25 DIAGNOSIS — E119 Type 2 diabetes mellitus without complications: Secondary | ICD-10-CM

## 2023-05-13 ENCOUNTER — Ambulatory Visit
Admission: RE | Admit: 2023-05-13 | Discharge: 2023-05-13 | Disposition: A | Discharge status: Home / Self Care | Payer: 59 | Source: Ambulatory Visit | Attending: Obstetrics and Gynecology | Admitting: Obstetrics and Gynecology

## 2023-05-13 DIAGNOSIS — N6489 Other specified disorders of breast: Secondary | ICD-10-CM | POA: Diagnosis present

## 2023-05-15 ENCOUNTER — Other Ambulatory Visit: Payer: Self-pay | Admitting: Family Medicine

## 2023-05-15 DIAGNOSIS — E119 Type 2 diabetes mellitus without complications: Secondary | ICD-10-CM

## 2023-05-16 ENCOUNTER — Other Ambulatory Visit: Payer: Self-pay | Admitting: Physician Assistant

## 2023-05-17 ENCOUNTER — Other Ambulatory Visit: Payer: Self-pay | Admitting: Family Medicine

## 2023-05-17 DIAGNOSIS — E119 Type 2 diabetes mellitus without complications: Secondary | ICD-10-CM

## 2023-06-11 ENCOUNTER — Other Ambulatory Visit: Payer: Self-pay | Admitting: Physician Assistant

## 2023-06-11 DIAGNOSIS — E119 Type 2 diabetes mellitus without complications: Secondary | ICD-10-CM

## 2023-07-05 ENCOUNTER — Encounter: Payer: Self-pay | Admitting: Physician Assistant

## 2023-07-19 ENCOUNTER — Ambulatory Visit: Payer: 59 | Admitting: Physician Assistant

## 2023-07-19 VITALS — BP 116/74 | HR 95 | Temp 97.8°F | Ht 64.0 in | Wt 281.6 lb

## 2023-07-19 DIAGNOSIS — E1159 Type 2 diabetes mellitus with other circulatory complications: Secondary | ICD-10-CM | POA: Diagnosis not present

## 2023-07-19 DIAGNOSIS — Z7985 Long-term (current) use of injectable non-insulin antidiabetic drugs: Secondary | ICD-10-CM

## 2023-07-19 DIAGNOSIS — F32A Depression, unspecified: Secondary | ICD-10-CM

## 2023-07-19 DIAGNOSIS — N943 Premenstrual tension syndrome: Secondary | ICD-10-CM | POA: Diagnosis not present

## 2023-07-19 DIAGNOSIS — I152 Hypertension secondary to endocrine disorders: Secondary | ICD-10-CM | POA: Diagnosis not present

## 2023-07-19 DIAGNOSIS — E119 Type 2 diabetes mellitus without complications: Secondary | ICD-10-CM

## 2023-07-19 DIAGNOSIS — Z6841 Body Mass Index (BMI) 40.0 and over, adult: Secondary | ICD-10-CM

## 2023-07-19 DIAGNOSIS — F419 Anxiety disorder, unspecified: Secondary | ICD-10-CM | POA: Diagnosis not present

## 2023-07-19 LAB — CBC WITH DIFFERENTIAL/PLATELET
Basophils Absolute: 0.1 10*3/uL (ref 0.0–0.1)
Basophils Relative: 1.5 % (ref 0.0–3.0)
Eosinophils Absolute: 0.1 10*3/uL (ref 0.0–0.7)
Eosinophils Relative: 1.3 % (ref 0.0–5.0)
HCT: 40.4 % (ref 36.0–46.0)
Hemoglobin: 13.5 g/dL (ref 12.0–15.0)
Lymphocytes Relative: 25.7 % (ref 12.0–46.0)
Lymphs Abs: 1.8 10*3/uL (ref 0.7–4.0)
MCHC: 33.5 g/dL (ref 30.0–36.0)
MCV: 86.6 fL (ref 78.0–100.0)
Monocytes Absolute: 0.6 10*3/uL (ref 0.1–1.0)
Monocytes Relative: 8.5 % (ref 3.0–12.0)
Neutro Abs: 4.5 10*3/uL (ref 1.4–7.7)
Neutrophils Relative %: 63 % (ref 43.0–77.0)
Platelets: 246 10*3/uL (ref 150.0–400.0)
RBC: 4.67 Mil/uL (ref 3.87–5.11)
RDW: 13.5 % (ref 11.5–15.5)
WBC: 7.1 10*3/uL (ref 4.0–10.5)

## 2023-07-19 LAB — COMPREHENSIVE METABOLIC PANEL
ALT: 19 U/L (ref 0–35)
AST: 21 U/L (ref 0–37)
Albumin: 4.2 g/dL (ref 3.5–5.2)
Alkaline Phosphatase: 52 U/L (ref 39–117)
BUN: 13 mg/dL (ref 6–23)
CO2: 26 meq/L (ref 19–32)
Calcium: 9.1 mg/dL (ref 8.4–10.5)
Chloride: 101 meq/L (ref 96–112)
Creatinine, Ser: 0.73 mg/dL (ref 0.40–1.20)
GFR: 100.25 mL/min (ref >60.00)
Glucose, Bld: 83 mg/dL (ref 70–99)
Potassium: 3.3 meq/L — ABNORMAL LOW (ref 3.5–5.1)
Sodium: 137 meq/L (ref 135–145)
Total Bilirubin: 0.5 mg/dL (ref 0.2–1.2)
Total Protein: 7 g/dL (ref 6.0–8.3)

## 2023-07-19 LAB — LIPID PANEL
Cholesterol: 148 mg/dL (ref 0–200)
HDL: 30.6 mg/dL — ABNORMAL LOW (ref >39.00)
LDL Cholesterol: 83 mg/dL (ref 0–99)
NonHDL: 117.6
Total CHOL/HDL Ratio: 5
Triglycerides: 174 mg/dL — ABNORMAL HIGH (ref 0.0–149.0)
VLDL: 34.8 mg/dL (ref 0.0–40.0)

## 2023-07-19 LAB — HEMOGLOBIN A1C: Hgb A1c MFr Bld: 5.8 % (ref 4.6–6.5)

## 2023-07-19 LAB — MICROALBUMIN / CREATININE URINE RATIO
Creatinine,U: 109.5 mg/dL
Microalb Creat Ratio: 0.8 mg/g (ref 0.0–30.0)
Microalb, Ur: 0.8 mg/dL (ref 0.0–1.9)

## 2023-07-19 LAB — TSH: TSH: 2.04 u[IU]/mL (ref 0.35–5.50)

## 2023-07-19 MED ORDER — FLUOXETINE HCL 20 MG PO CAPS: 20.0000 mg | ORAL_CAPSULE | Freq: Every morning | ORAL | 2 refills | Status: DC

## 2023-07-19 MED ORDER — OLMESARTAN MEDOXOMIL-HCTZ 40-25 MG PO TABS: 1.0000 | ORAL_TABLET | Freq: Every day | ORAL | 2 refills | Status: DC

## 2023-07-19 MED ORDER — OZEMPIC (2 MG/DOSE) 8 MG/3ML ~~LOC~~ SOPN: PEN_INJECTOR | SUBCUTANEOUS | 2 refills | Status: DC

## 2023-07-19 NOTE — Progress Notes (Signed)
Patient ID: Anita Webb, female    DOB: August 25, 1980, 42 y.o.   MRN: [id number]   Assessment & Plan:  PMS (premenstrual syndrome) -     CBC with Differential/Platelet -     Comprehensive metabolic panel -     TSH  Anxiety and depression -     FLUoxetine HCl; Take 1 capsule (20 mg total) by mouth every morning.  Dispense: 30 capsule; Refill: 2  Hypertension associated with type 2 diabetes mellitus (HCC) -     Hemoglobin A1c -     Microalbumin / creatinine urine ratio -     Olmesartan Medoxomil-HCTZ; Take 1 tablet by mouth daily.  Dispense: 30 tablet; Refill: 2  Type 2 diabetes mellitus without complication, without long-term current use of insulin (HCC) -     Comprehensive metabolic panel -     Hemoglobin A1c -     Lipid panel -     Microalbumin / creatinine urine ratio -     Ozempic (2 MG/DOSE); INJECT 2MG  INTO THE SKIN ONCE A WEEK  Dispense: 3 mL; Refill: 2  BMI 45.0-49.9, adult (HCC)   Assessment and Plan    Depression Reports worsening depressive symptoms, particularly during hormonal shifts. History of successful treatment with Lexapro, but stopped due to perceived loss of efficacy. Recent increase in stressors including caregiving responsibilities for elderly family members. -Start Prozac at a low dose. -Schedule follow-up in 4-6 weeks to assess response to medication.  Hypertension Blood pressure readings consistently in the 140s despite current treatment with Hyzaar. -Discontinue Hyzaar and start Benicar to potentially improve blood pressure control.  Type 2 Diabetes Currently managed with Ozempic 2mg . Reports blood glucose readings around 100. -Continue Ozempic 2mg . -Order labs to check A1C, cholesterol, and thyroid function.  General Health Maintenance -Order urine test to check protein levels. -Schedule blood work for routine monitoring. -Refill Ozempic prescription. -Follow-up appointment after the holidays.           Return in about 6 weeks  (around 08/30/2023) for recheck/follow-up.    Subjective:    Chief Complaint  Patient presents with   Medical Management of Chronic Issues    Patient in office for follow up to discuss restarting Lexapro, pt also due for fasting labs and urine for diabetes. Patient also wants to discuss new medication (antidepressant) last pap shows Feb 2021 and requested results.     HPI Discussed the use of AI scribe software for clinical note transcription with the patient, who gave verbal consent to proceed.  History of Present Illness   The patient, with a history of depression, type 2 diabetes, and high blood pressure, presents with worsening depression symptoms. She reports that these symptoms are particularly severe during hormonal shifts, which she believes may be related to her hormonal IUD. The patient describes experiencing severe lows, including suicidal ideation (denies any intent or plan), during these times. She has previously been on Lexapro for her depression, but stopped taking it a year and a half ago when it stopped working. The patient also reports significant life stressors, including caring for her 39 year old grandmother who recently had a stroke and her mother who has late-stage early-onset dementia.  In addition to her mental health concerns, the patient is also managing her type 2 diabetes with Ozempic and high blood pressure with Hyzaar. She reports that her blood sugar levels are generally around 100, but her blood pressure remains high despite medication.       Past Medical History:  Diagnosis Date   B12 deficiency    Depression    Diabetes mellitus without complication (HCC)    Phreesia 09/30/2020   Fatty liver    Hypertension    Joint pain    PCOS (polycystic ovarian syndrome)    Vitamin D deficiency     Past Surgical History:  Procedure Laterality Date   CESAREAN SECTION  02/2007   FOOT SURGERY  2000   bone removed from left heel     Family History  Problem  Relation Age of Onset   Hyperlipidemia Mother    Hypertension Mother    Dementia Mother    Thyroid disease Mother    Depression Mother    Obesity Mother    Hyperlipidemia Father    Hypertension Father    Cancer Maternal Grandmother        breast CA   Diabetes Maternal Grandmother    Breast cancer Maternal Grandmother 22   Hypertension Maternal Grandfather    Heart disease Maternal Grandfather    Hyperlipidemia Maternal Grandfather    Heart disease Paternal Grandmother    Hypertension Paternal Grandmother     Social History   Tobacco Use   Smoking status: Never   Smokeless tobacco: Never  Vaping Use   Vaping status: Never Used  Substance Use Topics   Alcohol use: Yes    Comment: 1-2 drink per week   Drug use: No     Allergies  Allergen Reactions   Erythromycin     GI upset   Trulicity [Dulaglutide] Rash    Review of Systems NEGATIVE UNLESS OTHERWISE INDICATED IN HPI      Objective:     BP 116/74 (BP Location: Left Arm, Patient Position: Sitting)   Pulse 95   Temp 97.8 F (36.6 C) (Temporal)   Ht 5\' 4"  (1.626 m)   Wt 281 lb 9.6 oz (127.7 kg)   SpO2 99%   BMI 48.34 kg/m   Wt Readings from Last 3 Encounters:  07/19/23 281 lb 9.6 oz (127.7 kg)  09/07/22 283 lb 12.8 oz (128.7 kg)  08/12/22 287 lb (130.2 kg)    BP Readings from Last 3 Encounters:  07/19/23 116/74  10/27/22 129/82  09/07/22 124/80     Physical Exam Vitals and nursing note reviewed.  Constitutional:      Appearance: Normal appearance. She is obese.  Eyes:     Extraocular Movements: Extraocular movements intact.     Conjunctiva/sclera: Conjunctivae normal.     Pupils: Pupils are equal, round, and reactive to light.  Cardiovascular:     Rate and Rhythm: Normal rate and regular rhythm.     Pulses: Normal pulses.     Heart sounds: Normal heart sounds. No murmur heard. Pulmonary:     Effort: Pulmonary effort is normal.     Breath sounds: Normal breath sounds.  Neurological:      General: No focal deficit present.     Mental Status: She is alert and oriented to person, place, and time.  Psychiatric:        Mood and Affect: Mood normal.        Behavior: Behavior normal.     Mayco Walrond M Dene Nazir, PA-C

## 2023-07-19 NOTE — Patient Instructions (Signed)
VISIT SUMMARY:  During today's visit, we discussed your worsening depression symptoms, high blood pressure, and management of type 2 diabetes. We also reviewed your general health maintenance and made some adjustments to your treatment plan to better address your current health needs.  YOUR PLAN:  -DEPRESSION: Depression is a mental health condition characterized by persistent feelings of sadness and loss of interest. Given your worsening symptoms, particularly during hormonal shifts, we are starting you on a low dose of Prozac. We will follow up in 4-6 weeks to see how you are responding to the new medication.  -HYPERTENSION: Hypertension, or high blood pressure, is a condition where the force of the blood against your artery walls is too high. Since your blood pressure readings are still high despite taking Hyzaar, we are switching your medication to Benicar to help better control your blood pressure.  -TYPE 2 DIABETES: Type 2 diabetes is a chronic condition that affects the way your body processes blood sugar. Your blood glucose levels are currently well-managed with Ozempic 2mg , so we will continue this medication. We will also order labs to check your A1C, cholesterol, and thyroid function to ensure everything is in good range.  -GENERAL HEALTH MAINTENANCE: We will conduct routine monitoring to ensure your overall health. This includes ordering a urine test to check protein levels and scheduling blood work. We will also refill your Ozempic prescription and plan a follow-up appointment after the holidays.  INSTRUCTIONS:  Please start taking Prozac as prescribed and monitor your symptoms. We will follow up in 4-6 weeks to assess your response to the medication. Continue taking Ozempic 2mg  for your diabetes and switch to Benicar for your blood pressure. Make sure to complete the lab tests for A1C, cholesterol, thyroid function, and urine protein levels as ordered. Schedule your follow-up  appointment after the holidays.

## 2023-08-06 ENCOUNTER — Other Ambulatory Visit: Payer: Self-pay | Admitting: Physician Assistant

## 2023-08-09 ENCOUNTER — Other Ambulatory Visit (INDEPENDENT_AMBULATORY_CARE_PROVIDER_SITE_OTHER): Payer: Self-pay | Admitting: Adult Health

## 2023-08-09 DIAGNOSIS — F411 Generalized anxiety disorder: Secondary | ICD-10-CM

## 2023-08-30 ENCOUNTER — Ambulatory Visit (INDEPENDENT_AMBULATORY_CARE_PROVIDER_SITE_OTHER): Payer: BC Managed Care – PPO | Admitting: Physician Assistant

## 2023-08-30 VITALS — BP 118/74 | HR 79 | Temp 97.2°F | Ht 64.0 in | Wt 281.4 lb

## 2023-08-30 DIAGNOSIS — E876 Hypokalemia: Secondary | ICD-10-CM

## 2023-08-30 DIAGNOSIS — F419 Anxiety disorder, unspecified: Secondary | ICD-10-CM

## 2023-08-30 DIAGNOSIS — F32A Depression, unspecified: Secondary | ICD-10-CM

## 2023-08-30 MED ORDER — FLUOXETINE HCL 20 MG PO CAPS: 20.0000 mg | ORAL_CAPSULE | Freq: Every morning | ORAL | 1 refills | Status: DC

## 2023-08-30 NOTE — Progress Notes (Signed)
 Patient ID: Anita Webb, female    DOB: 21-Sep-1980, 43 y.o.   MRN: [id number]   Assessment & Plan:  Anxiety and depression -     FLUoxetine  HCl; Take 1 capsule (20 mg total) by mouth every morning.  Dispense: 90 capsule; Refill: 1  Low serum potassium level   Assessment and Plan    Depression Stable on Prozac  20mg  daily. No adverse effects reported. Patient reports significant improvement in symptoms. -Continue Prozac  20mg  daily. -Refill Prozac  20mg  for 90 days    Hypokalemia Mildly low potassium noted on last labs. No symptoms reported. -No need for recheck at this time per patient preference.  Follow-up in 6 months for routine care and lab work.         Return in about 6 months (around 02/27/2024) for recheck/follow-up.    Subjective:    Chief Complaint  Patient presents with   Medical Management of Chronic Issues    Pt in office for 6 wk f/u w/ PCP; pt has no concerns to discuss, things are much better per patient    HPI Discussed the use of AI scribe software for clinical note transcription with the patient, who gave verbal consent to proceed.  History of Present Illness   The patient, with a history of depression and diabetes, presents for a follow-up visit. She reports a significant improvement in her mood since starting Prozac , despite initial physical side effects. She feels that the medication has taken the 'extra edge off' without causing numbness or other unpleasant side effects.  Her blood sugars have also been well controlled on Ozempic , with a recent A1c of 5.8. She had a slightly low potassium level at her last visit, but she does not wish to recheck it today and reports no concerning symptoms.  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 She expresses satisfaction with her current situation and is trying to keep her mother at home for as long as possible.        Past Medical History:  Diagnosis Date   B12 deficiency    Depression    Diabetes mellitus without complication (HCC)    Phreesia 09/30/2020   Fatty liver    Hypertension    Joint pain    PCOS (polycystic ovarian syndrome)    Vitamin D  deficiency     Past Surgical History:  Procedure Laterality Date   CESAREAN SECTION  02/2007   FOOT SURGERY  2000   bone removed from left heel     Family History  Problem Relation Age of Onset   Hyperlipidemia Mother    Hypertension Mother    Dementia Mother    Thyroid disease Mother    Depression Mother    Obesity Mother    Hyperlipidemia Father    Hypertension Father    Cancer Maternal Grandmother        breast CA   Diabetes Maternal Grandmother    Breast cancer Maternal Grandmother 54   Hypertension Maternal Grandfather    Heart disease Maternal Grandfather    Hyperlipidemia Maternal Grandfather    Heart disease Paternal Grandmother    Hypertension Paternal Grandmother     Social History   Tobacco Use   Smoking status: Never   Smokeless tobacco: Never  Vaping Use   Vaping status: Never Used  Substance Use Topics   Alcohol use: Yes    Comment: 1-2 drink per week  Drug use: No     Allergies  Allergen Reactions   Erythromycin     GI upset   Trulicity  [Dulaglutide ] Rash    Review of Systems NEGATIVE UNLESS OTHERWISE INDICATED IN HPI      Objective:     BP 118/74 (BP Location: Left Arm, Patient Position: Sitting, Cuff Size: Large)   Pulse 79   Temp (!) 97.2 F (36.2 C) (Temporal)   Ht 5' 4 (1.626 m)   Wt 281 lb 6.4 oz (127.6 kg)   SpO2 97%   BMI 48.30 kg/m   Wt Readings from Last 3 Encounters:  08/30/23 281 lb 6.4 oz (127.6 kg)  07/19/23 281 lb 9.6 oz (127.7 kg)  09/07/22 283 lb 12.8 oz (128.7 kg)    BP Readings from Last 3 Encounters:  08/30/23 118/74  07/19/23 116/74  10/27/22 129/82     Physical Exam Vitals and nursing note reviewed.  Constitutional:      Appearance: Normal  appearance. She is obese.  Eyes:     Extraocular Movements: Extraocular movements intact.     Conjunctiva/sclera: Conjunctivae normal.     Pupils: Pupils are equal, round, and reactive to light.  Cardiovascular:     Rate and Rhythm: Normal rate and regular rhythm.     Pulses: Normal pulses.     Heart sounds: Normal heart sounds. No murmur heard. Pulmonary:     Effort: Pulmonary effort is normal.     Breath sounds: Normal breath sounds.  Neurological:     General: No focal deficit present.     Mental Status: She is alert and oriented to person, place, and time.  Psychiatric:        Mood and Affect: Mood normal.        Behavior: Behavior normal.           Aayush Gelpi M Jorian [place], PA-C

## 2023-09-15 ENCOUNTER — Other Ambulatory Visit (HOSPITAL_COMMUNITY): Payer: Self-pay

## 2023-09-15 ENCOUNTER — Telehealth: Payer: Self-pay

## 2023-09-15 NOTE — Telephone Encounter (Signed)
*  Primary  Pharmacy Patient Advocate Encounter   Received notification from Fax that prior authorization for Ozempic (2 MG/DOSE) 8MG /3ML pen-injectors  is required/requested.   Insurance verification completed.   The patient is insured through [facility] .   Per test claim: PA required; PA submitted to above mentioned insurance via CoverMyMeds Key/confirmation #/EOC [facility] Status is pending

## 2023-09-16 ENCOUNTER — Encounter: Payer: Self-pay | Admitting: Physician Assistant

## 2023-09-16 NOTE — Telephone Encounter (Signed)
Called patient and advised PA has been submitted awaiting on determination, pt advised she will contact insurance to see if this has been completed. No further action needed at this time

## 2023-09-17 ENCOUNTER — Telehealth: Payer: Self-pay | Admitting: Physician Assistant

## 2023-09-17 NOTE — Telephone Encounter (Signed)
Called pt to advise PA was approved per [facility]; pt states still needing to call medication management office; called number provided in message after waiting 15 mins was unable to speak with anyone since direct ext wasn't provided.

## 2023-09-17 NOTE — Telephone Encounter (Signed)
Additional information requested from patients plan via Fax.   Labs and chart notes sent via fax to [phone]  Case Key: [id number]

## 2023-09-17 NOTE — Telephone Encounter (Signed)
Pa for a ozempic  injection 8mg   medication care management office number  [phone]

## 2023-09-22 ENCOUNTER — Other Ambulatory Visit (HOSPITAL_COMMUNITY): Payer: Self-pay

## 2023-09-22 NOTE — Telephone Encounter (Signed)
Pharmacy Patient Advocate Encounter  Received notification from [facility] that Prior Authorization for Ozempic (2 MG/DOSE) 8MG /3ML pen-injectors  has been APPROVED from 09/17/2023 to 09/14/2024. Ran test claim, Copay is $977.83. This test claim was processed through [facility]- copay amounts may vary at other pharmacies due to pharmacy/plan contracts, or as the patient moves through the different stages of their insurance plan.

## 2023-10-15 ENCOUNTER — Other Ambulatory Visit: Payer: Self-pay | Admitting: Physician Assistant

## 2023-10-15 DIAGNOSIS — E1159 Type 2 diabetes mellitus with other circulatory complications: Secondary | ICD-10-CM

## 2023-11-10 ENCOUNTER — Other Ambulatory Visit: Payer: Self-pay | Admitting: Physician Assistant

## 2023-11-10 DIAGNOSIS — E1159 Type 2 diabetes mellitus with other circulatory complications: Secondary | ICD-10-CM

## 2023-12-05 ENCOUNTER — Other Ambulatory Visit: Payer: Self-pay | Admitting: Physician Assistant

## 2023-12-05 DIAGNOSIS — E119 Type 2 diabetes mellitus without complications: Secondary | ICD-10-CM

## 2023-12-06 ENCOUNTER — Other Ambulatory Visit: Payer: Self-pay | Admitting: Physician Assistant

## 2023-12-06 DIAGNOSIS — I152 Hypertension secondary to endocrine disorders: Secondary | ICD-10-CM

## 2024-01-01 ENCOUNTER — Other Ambulatory Visit: Payer: Self-pay | Admitting: Physician Assistant

## 2024-01-01 DIAGNOSIS — E119 Type 2 diabetes mellitus without complications: Secondary | ICD-10-CM

## 2024-02-12 ENCOUNTER — Other Ambulatory Visit: Payer: Self-pay | Admitting: Physician Assistant

## 2024-02-12 DIAGNOSIS — I152 Hypertension secondary to endocrine disorders: Secondary | ICD-10-CM

## 2024-02-12 DIAGNOSIS — E119 Type 2 diabetes mellitus without complications: Secondary | ICD-10-CM

## 2024-02-28 ENCOUNTER — Ambulatory Visit: Payer: Self-pay | Admitting: Physician Assistant

## 2024-03-07 ENCOUNTER — Other Ambulatory Visit: Payer: Self-pay | Admitting: Physician Assistant

## 2024-03-07 DIAGNOSIS — E119 Type 2 diabetes mellitus without complications: Secondary | ICD-10-CM

## 2024-03-14 ENCOUNTER — Ambulatory Visit: Admitting: Physician Assistant

## 2024-03-14 VITALS — BP 132/76 | HR 86 | Temp 98.1°F | Ht 64.0 in | Wt 285.4 lb

## 2024-03-14 DIAGNOSIS — F32A Depression, unspecified: Secondary | ICD-10-CM

## 2024-03-14 DIAGNOSIS — E119 Type 2 diabetes mellitus without complications: Secondary | ICD-10-CM

## 2024-03-14 DIAGNOSIS — Z7985 Long-term (current) use of injectable non-insulin antidiabetic drugs: Secondary | ICD-10-CM

## 2024-03-14 DIAGNOSIS — F419 Anxiety disorder, unspecified: Secondary | ICD-10-CM | POA: Diagnosis not present

## 2024-03-14 DIAGNOSIS — Z23 Encounter for immunization: Secondary | ICD-10-CM | POA: Diagnosis not present

## 2024-03-14 DIAGNOSIS — I152 Hypertension secondary to endocrine disorders: Secondary | ICD-10-CM

## 2024-03-14 DIAGNOSIS — E782 Mixed hyperlipidemia: Secondary | ICD-10-CM | POA: Diagnosis not present

## 2024-03-14 DIAGNOSIS — E1159 Type 2 diabetes mellitus with other circulatory complications: Secondary | ICD-10-CM

## 2024-03-14 LAB — MICROALBUMIN / CREATININE URINE RATIO
Creatinine,U: 68.2 mg/dL
Microalb Creat Ratio: 29.9 mg/g (ref 0.0–30.0)
Microalb, Ur: 2 mg/dL — ABNORMAL HIGH (ref 0.0–1.9)

## 2024-03-14 LAB — LIPID PANEL
Cholesterol: 160 mg/dL (ref 0–200)
HDL: 39.8 mg/dL (ref >39.00)
LDL Cholesterol: 84 mg/dL (ref 0–99)
NonHDL: 120.27
Total CHOL/HDL Ratio: 4
Triglycerides: 181 mg/dL — ABNORMAL HIGH (ref 0.0–149.0)
VLDL: 36.2 mg/dL (ref 0.0–40.0)

## 2024-03-14 LAB — COMPREHENSIVE METABOLIC PANEL WITH GFR
ALT: 20 U/L (ref 0–35)
AST: 24 U/L (ref 0–37)
Albumin: 4.2 g/dL (ref 3.5–5.2)
Alkaline Phosphatase: 39 U/L (ref 39–117)
BUN: 13 mg/dL (ref 6–23)
CO2: 27 meq/L (ref 19–32)
Calcium: 9.1 mg/dL (ref 8.4–10.5)
Chloride: 101 meq/L (ref 96–112)
Creatinine, Ser: 0.71 mg/dL (ref 0.40–1.20)
GFR: 103.17 mL/min (ref >60.00)
Glucose, Bld: 89 mg/dL (ref 70–99)
Potassium: 3.7 meq/L (ref 3.5–5.1)
Sodium: 136 meq/L (ref 135–145)
Total Bilirubin: 0.4 mg/dL (ref 0.2–1.2)
Total Protein: 6.8 g/dL (ref 6.0–8.3)

## 2024-03-14 LAB — HEMOGLOBIN A1C: Hgb A1c MFr Bld: 6.1 % (ref 4.6–6.5)

## 2024-03-14 MED ORDER — OLMESARTAN MEDOXOMIL-HCTZ 40-25 MG PO TABS
1.0000 | ORAL_TABLET | Freq: Every day | ORAL | 1 refills | Status: AC
Start: 2024-03-14 — End: ?

## 2024-03-14 MED ORDER — OZEMPIC (2 MG/DOSE) 8 MG/3ML ~~LOC~~ SOPN: PEN_INJECTOR | SUBCUTANEOUS | 1 refills | Status: DC

## 2024-03-14 MED ORDER — FLUOXETINE HCL 20 MG PO CAPS: 20.0000 mg | ORAL_CAPSULE | Freq: Every morning | ORAL | 1 refills | Status: DC

## 2024-03-14 NOTE — Progress Notes (Signed)
 Patient ID: Anita Webb, female    DOB: Jan 26, 1981, 43 y.o.   MRN: [id number]   Assessment & Plan:  Type 2 diabetes mellitus without complication, without long-term current use of insulin  (HCC) -     Comprehensive metabolic panel with GFR -     Hemoglobin A1c -     Microalbumin / creatinine urine ratio -     Ozempic  (2 MG/DOSE); INJECT 2MG   SUBCUTANEOUSLY ONCE A WEEK  Dispense: 3 mL; Refill: 1  Hypertension associated with type 2 diabetes mellitus (HCC) -     Olmesartan  Medoxomil-HCTZ; Take 1 tablet by mouth daily.  Dispense: 90 tablet; Refill: 1  Anxiety and depression -     FLUoxetine  HCl; Take 1 capsule (20 mg total) by mouth every morning.  Dispense: 90 capsule; Refill: 1  Elevated cholesterol with elevated triglycerides -     Lipid panel  Need for pneumococcal vaccine -     Pneumococcal conjugate vaccine 20-valent     Assessment & Plan Type 2 Diabetes Mellitus Type 2 Diabetes Mellitus is well-controlled with Ozempic  2 mg once weekly. A1c has been stable at 5.8% for the past year. No current symptoms of hyperglycemia reported. 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 - Order A1c test - Continue Ozempic  2 mg once weekly - Monitor blood glucose levels as needed Lab Results  Component Value Date   HGBA1C 5.8 07/19/2023   HGBA1C 5.8 (A) 08/12/2022   HGBA1C 5.2 12/10/2021     Hypertension Hypertension is well-controlled with Benicar  and hydrochlorothiazide. Blood pressure readings have been stable. - Continue Benicar  and hydrochlorothiazide  Hyperlipidemia Cholesterol levels are borderline, with LDL previously at 83 mg/dL. She prefers to avoid medication if possible but is open to treatment if necessary. Family history of heart disease noted, including a grandfather who died of a myocardial infarction at around 31 years old. Both parents are on cholesterol medication. - Order lipid panel - Discuss potential need for cholesterol medication  based on test results  Depression and Anxiety Depression and anxiety are managed with Prozac  20 mg daily. She reports feeling well with current treatment. - Continue Prozac  20 mg daily  General Health Maintenance Routine health maintenance is being addressed. She received a pneumonia vaccination. Regular screenings and check-ups are in place. She is on a five-year plan for Pap smears with HPV testing. - Ensure regular follow-up with gynecologist for Pap smears - Request eye exam report from ophthalmologist      Return in about 6 months (around 09/14/2024) for recheck/follow-up.    Subjective:    Chief Complaint  Patient presents with   Diabetes    Pt in office for Diabetes and anxiety/depression follow up; pt says since starting Ozempic  glucose staying around 110; pt willing to go up on dose if PCP recommends;     HPI Discussed the use of AI scribe software for clinical note transcription with the patient, who gave verbal consent to proceed.  History of Present Illness Anita Webb is a 43 year old female with diabetes and hypertension who presents for routine follow-up.  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 Her grandmother is currently hospitalized recovering from COVID-19 and pneumonia and is expected to be released to a rehabilitation facility. Her mother, who lives nearby, has early-onset dementia and recently suffered a head injury from a fall.  She has a history of diabetes,  which is well-controlled with Ozempic  2 mg once a week. Blood sugar levels have remained stable, except for one instance of elevation due to steroid use. She no longer checks her blood sugar daily unless she feels unwell.  Hypertension is managed with Benicar  and hydrochlorothiazide, and her blood pressure has been stable on this regimen.  She is on Prozac  20 mg for anxiety and depression, which has been very  helpful. No recent changes in mood or anxiety levels.  She recently traveled to [place]  for her son's mock trial competition and visited the [facility].     Past Medical History:  Diagnosis Date   Allergy    B12 deficiency    Depression    Diabetes mellitus without complication (HCC)    Phreesia 09/30/2020   Fatty liver    Hyperlipidemia 08/2020   Hypertension    Joint pain    PCOS (polycystic ovarian syndrome)    Vitamin D  deficiency     Past Surgical History:  Procedure Laterality Date   CESAREAN SECTION  02/2007   FOOT SURGERY  2000   bone removed from left heel     Family History  Problem Relation Age of Onset   Hyperlipidemia Mother    Hypertension Mother    Dementia Mother 14       early onset   Thyroid disease Mother    Depression Mother    Obesity Mother    Hyperlipidemia Father    Hypertension Father    Cancer Maternal Grandmother        breast CA   Diabetes Maternal Grandmother    Breast cancer Maternal Grandmother 56   Hypertension Maternal Grandfather    Heart disease Maternal Grandfather    Hyperlipidemia Maternal Grandfather    Heart disease Paternal Grandmother    Hypertension Paternal Grandmother     Social History   Tobacco Use   Smoking status: Never   Smokeless tobacco: Never  Vaping Use   Vaping status: Never Used  Substance Use Topics   Alcohol use: Yes    Comment: 1-2 drink per week   Drug use: No     Allergies  Allergen Reactions   Erythromycin     GI upset   Trulicity  [Dulaglutide ] Rash    Review of Systems NEGATIVE UNLESS OTHERWISE INDICATED IN HPI      Objective:     BP 132/76 (BP Location: Left Arm, Patient Position: Sitting, Cuff Size: Large)   Pulse 86   Temp 98.1 F (36.7 C) (Temporal)   Ht 5' 4 (1.626 m)   Wt 285 lb 6.4 oz (129.5 kg)   SpO2 94%   BMI 48.99 kg/m   Wt Readings from Last 3 Encounters:  03/14/24 285 lb 6.4 oz (129.5 kg)  08/30/23 281 lb 6.4 oz (127.6 kg)  07/19/23 281 lb 9.6 oz  (127.7 kg)    BP Readings from Last 3 Encounters:  03/14/24 132/76  08/30/23 118/74  07/19/23 116/74     Physical Exam Vitals and nursing note reviewed.  Constitutional:      Appearance: Normal appearance. She is obese.  Eyes:     Extraocular Movements: Extraocular movements intact.     Conjunctiva/sclera: Conjunctivae normal.     Pupils: Pupils are equal, round, and reactive to light.  Cardiovascular:     Rate and Rhythm: Normal rate and regular rhythm.     Pulses: Normal pulses.     Heart sounds: Normal heart sounds. No murmur heard. Pulmonary:  Effort: Pulmonary effort is normal.     Breath sounds: Normal breath sounds.  Neurological:     General: No focal deficit present.     Mental Status: She is alert and oriented to person, place, and time.  Psychiatric:        Mood and Affect: Mood normal.        Behavior: Behavior normal.             Mennie Spiller M Yeng Perz, PA-C

## 2024-03-16 ENCOUNTER — Ambulatory Visit: Payer: Self-pay | Admitting: Physician Assistant

## 2024-05-24 ENCOUNTER — Ambulatory Visit (INDEPENDENT_AMBULATORY_CARE_PROVIDER_SITE_OTHER): Admitting: Physician Assistant

## 2024-05-24 ENCOUNTER — Encounter: Payer: Self-pay | Admitting: Physician Assistant

## 2024-05-24 VITALS — BP 122/80 | HR 70 | Temp 98.1°F | Ht 64.0 in | Wt 285.5 lb

## 2024-05-24 DIAGNOSIS — R052 Subacute cough: Secondary | ICD-10-CM

## 2024-05-24 DIAGNOSIS — J309 Allergic rhinitis, unspecified: Secondary | ICD-10-CM | POA: Diagnosis not present

## 2024-05-24 MED ORDER — AZITHROMYCIN 250 MG PO TABS: ORAL_TABLET | ORAL | 0 refills | Status: AC

## 2024-05-24 NOTE — Progress Notes (Signed)
 Anita Webb is a 43 y.o. female here for a follow up of a pre-existing problem.  History of Present Illness:   Chief Complaint  Patient presents with   Cough    Pt c/o lingering cough x 3 months, expectorating clear sputum, coughing at times where she vomits and pulled muscle left side of back few days ago from coughing. Cough worse at night.   Discussed the use of AI scribe software for clinical note transcription with the patient, who gave verbal consent to proceed.  History of Present Illness   Anita Webb is a 43 year old female who presents with a persistent post-viral cough following a COVID-19 infection.  She contracted COVID-19 at the beginning of July and initially recovered after a few days. Since then, she has experienced a lingering cough with clear mucus production. The cough occurs both day and night, often waking her and leading to severe coughing fits that induce vomiting. Recently, she felt like she pulled a muscle during a coughing fit, resulting in significant pain when coughing, described as 'worse than childbirth'.  She has allergies and has been taking Benadryl, which she also uses to aid sleep, but it has not alleviated her symptoms. She has used other antihistamines like Claritin, Allegra, and Zyrtec. No asthma, but a past severe RSV infection has made recovery from colds more challenging. No hemoptysis, and mucus is clear and thin. Occasional throat drainage is present, but no postnasal drip causing the cough.  She experienced vertigo a couple of weeks ago, attributed to inner ear issues, and reports a fluid sensation in her ears throughout the coughing period.  She lives with her 67 year old grandmother, who also contracted COVID-19 and was hospitalized for three weeks.        Past Medical History:  Diagnosis Date   Allergy    B12 deficiency    Depression    Diabetes mellitus without complication (HCC)    Phreesia 09/30/2020   Fatty liver     Hyperlipidemia 08/2020   Hypertension    Joint pain    PCOS (polycystic ovarian syndrome)    Vitamin D  deficiency      Social History   Tobacco Use   Smoking status: Never   Smokeless tobacco: Never  Vaping Use   Vaping status: Never Used  Substance Use Topics   Alcohol use: Yes    Comment: 1-2 drink per week   Drug use: No    Past Surgical History:  Procedure Laterality Date   CESAREAN SECTION  02/2007   FOOT SURGERY  2000   bone removed from left heel     Family History  Problem Relation Age of Onset   Hyperlipidemia Mother    Hypertension Mother    Dementia Mother 26       early onset   Thyroid disease Mother    Depression Mother    Obesity Mother    Hyperlipidemia Father    Hypertension Father    Cancer Maternal Grandmother        breast CA   Diabetes Maternal Grandmother    Breast cancer Maternal Grandmother 56   Hypertension Maternal Grandfather    Heart disease Maternal Grandfather    Hyperlipidemia Maternal Grandfather    Heart disease Paternal Grandmother    Hypertension Paternal Grandmother     Allergies  Allergen Reactions   Erythromycin     GI upset   Trulicity  [Dulaglutide ] Rash    Current Medications:   Current Outpatient Medications:  azithromycin (ZITHROMAX) 250 MG tablet, Take 2 tablets on day 1, then 1 tablet daily on days 2 through 5, Disp: 6 tablet, Rfl: 0   Blood Glucose Monitoring Suppl (TRUE METRIX METER) w/Device KIT, Use as directed, Disp: 1 kit, Rfl: 0   FLUoxetine  (PROZAC ) 20 MG capsule, Take 1 capsule (20 mg total) by mouth every morning., Disp: 90 capsule, Rfl: 1   glucose blood (TRUE METRIX BLOOD GLUCOSE TEST) test strip, Use as instructed, Disp: 100 each, Rfl: 12   levonorgestrel (LILETTA) 19.5 MCG/DAY IUD IUD, 1 each by Intrauterine route once., Disp: , Rfl:    olmesartan -hydrochlorothiazide (BENICAR  HCT) 40-25 MG tablet, Take 1 tablet by mouth daily., Disp: 90 tablet, Rfl: 1   Semaglutide , 2 MG/DOSE, (OZEMPIC , 2  MG/DOSE,) 8 MG/3ML SOPN, INJECT 2MG   SUBCUTANEOUSLY ONCE A WEEK, Disp: 3 mL, Rfl: 1   TRUEplus Lancets 28G MISC, Use as directed, Disp: 100 each, Rfl: 4   Review of Systems:   Negative unless otherwise specified per HPI.  Vitals:   Vitals:   05/24/24 0944  BP: 122/80  Pulse: 70  Temp: 98.1 F (36.7 C)  TempSrc: Temporal  SpO2: 96%  Weight: 285 lb 8 oz (129.5 kg)  Height: 5' 4 (1.626 m)     Body mass index is 49.01 kg/m.  Physical Exam:   Physical Exam Vitals and nursing note reviewed.  Constitutional:      General: She is not in acute distress.    Appearance: She is well-developed. She is not ill-appearing or toxic-appearing.  HENT:     Head: Normocephalic and atraumatic.     Right Ear: Ear canal and external ear normal. A middle ear effusion is present. Tympanic membrane is not erythematous, retracted or bulging.     Left Ear: Tympanic membrane, ear canal and external ear normal. Tympanic membrane is not erythematous, retracted or bulging.     Nose: Nose normal.     Right Sinus: No maxillary sinus tenderness or frontal sinus tenderness.     Left Sinus: No maxillary sinus tenderness or frontal sinus tenderness.     Mouth/Throat:     Pharynx: Uvula midline. No posterior oropharyngeal erythema.  Eyes:     General: Lids are normal.     Conjunctiva/sclera: Conjunctivae normal.  Neck:     Trachea: Trachea normal.  Cardiovascular:     Rate and Rhythm: Normal rate and regular rhythm.     Pulses: Normal pulses.     Heart sounds: Normal heart sounds, S1 normal and S2 normal.  Pulmonary:     Effort: Pulmonary effort is normal.     Breath sounds: Normal breath sounds. No decreased breath sounds, wheezing, rhonchi or rales.  Lymphadenopathy:     Cervical: No cervical adenopathy.  Skin:    General: Skin is warm and dry.  Neurological:     Mental Status: She is alert.     GCS: GCS eye subscore is 4. GCS verbal subscore is 5. GCS motor subscore is 6.  Psychiatric:         Speech: Speech normal.        Behavior: Behavior normal. Behavior is cooperative.     Assessment and Plan:   Assessment and Plan    Subacute cough Persistent cough post-COVID-19, likely post-viral bronchitis. No wheezing or crackles. Muscular pain from coughing, not pneumonia or rib fracture. - Prescribed azithromycin (Z-Pak) with food to prevent GI upset. - Consider inhaler vs chest xray if no improvement.  Allergic rhinitis Allergies may worsen  cough. Benadryl ineffective for current symptoms. - Switch to or add daily 24-hour antihistamine such as Claritin, Allegra, or Zyrtec.          Lucie Buttner, PA-C

## 2024-06-10 ENCOUNTER — Other Ambulatory Visit: Payer: Self-pay | Admitting: Physician Assistant

## 2024-06-10 DIAGNOSIS — E119 Type 2 diabetes mellitus without complications: Secondary | ICD-10-CM

## 2024-07-13 ENCOUNTER — Other Ambulatory Visit: Payer: Self-pay | Admitting: Physician Assistant

## 2024-07-13 DIAGNOSIS — E119 Type 2 diabetes mellitus without complications: Secondary | ICD-10-CM

## 2024-07-19 DIAGNOSIS — M5431 Sciatica, right side: Secondary | ICD-10-CM | POA: Diagnosis not present

## 2024-07-19 DIAGNOSIS — M9903 Segmental and somatic dysfunction of lumbar region: Secondary | ICD-10-CM | POA: Diagnosis not present

## 2024-07-19 DIAGNOSIS — M9905 Segmental and somatic dysfunction of pelvic region: Secondary | ICD-10-CM | POA: Diagnosis not present

## 2024-07-19 DIAGNOSIS — M5416 Radiculopathy, lumbar region: Secondary | ICD-10-CM | POA: Diagnosis not present

## 2024-07-24 DIAGNOSIS — M5416 Radiculopathy, lumbar region: Secondary | ICD-10-CM | POA: Diagnosis not present

## 2024-07-24 DIAGNOSIS — M9905 Segmental and somatic dysfunction of pelvic region: Secondary | ICD-10-CM | POA: Diagnosis not present

## 2024-07-24 DIAGNOSIS — M9903 Segmental and somatic dysfunction of lumbar region: Secondary | ICD-10-CM | POA: Diagnosis not present

## 2024-07-24 DIAGNOSIS — M5431 Sciatica, right side: Secondary | ICD-10-CM | POA: Diagnosis not present

## 2024-07-26 DIAGNOSIS — M5416 Radiculopathy, lumbar region: Secondary | ICD-10-CM | POA: Diagnosis not present

## 2024-07-26 DIAGNOSIS — M5431 Sciatica, right side: Secondary | ICD-10-CM | POA: Diagnosis not present

## 2024-07-26 DIAGNOSIS — M9905 Segmental and somatic dysfunction of pelvic region: Secondary | ICD-10-CM | POA: Diagnosis not present

## 2024-07-26 DIAGNOSIS — M9903 Segmental and somatic dysfunction of lumbar region: Secondary | ICD-10-CM | POA: Diagnosis not present

## 2024-07-31 ENCOUNTER — Encounter: Payer: Self-pay | Admitting: Physician Assistant

## 2024-07-31 ENCOUNTER — Other Ambulatory Visit: Payer: Self-pay | Admitting: Family

## 2024-07-31 MED ORDER — ZOLPIDEM TARTRATE 10 MG PO TABS: 10.0000 mg | ORAL_TABLET | Freq: Every evening | ORAL | 1 refills | Status: DC | PRN

## 2024-07-31 NOTE — Telephone Encounter (Signed)
 Please see pt msg and advise

## 2024-08-03 DIAGNOSIS — M5416 Radiculopathy, lumbar region: Secondary | ICD-10-CM | POA: Diagnosis not present

## 2024-08-03 DIAGNOSIS — M9905 Segmental and somatic dysfunction of pelvic region: Secondary | ICD-10-CM | POA: Diagnosis not present

## 2024-08-03 DIAGNOSIS — M5431 Sciatica, right side: Secondary | ICD-10-CM | POA: Diagnosis not present

## 2024-08-03 DIAGNOSIS — M9903 Segmental and somatic dysfunction of lumbar region: Secondary | ICD-10-CM | POA: Diagnosis not present

## 2024-08-08 DIAGNOSIS — M5431 Sciatica, right side: Secondary | ICD-10-CM | POA: Diagnosis not present

## 2024-08-08 DIAGNOSIS — M9903 Segmental and somatic dysfunction of lumbar region: Secondary | ICD-10-CM | POA: Diagnosis not present

## 2024-08-08 DIAGNOSIS — M9905 Segmental and somatic dysfunction of pelvic region: Secondary | ICD-10-CM | POA: Diagnosis not present

## 2024-08-08 DIAGNOSIS — M5416 Radiculopathy, lumbar region: Secondary | ICD-10-CM | POA: Diagnosis not present

## 2024-08-10 DIAGNOSIS — M5431 Sciatica, right side: Secondary | ICD-10-CM | POA: Diagnosis not present

## 2024-08-10 DIAGNOSIS — M9905 Segmental and somatic dysfunction of pelvic region: Secondary | ICD-10-CM | POA: Diagnosis not present

## 2024-08-10 DIAGNOSIS — M5416 Radiculopathy, lumbar region: Secondary | ICD-10-CM | POA: Diagnosis not present

## 2024-08-10 DIAGNOSIS — M9903 Segmental and somatic dysfunction of lumbar region: Secondary | ICD-10-CM | POA: Diagnosis not present

## 2024-08-12 ENCOUNTER — Other Ambulatory Visit: Payer: Self-pay | Admitting: Physician Assistant

## 2024-08-12 DIAGNOSIS — E119 Type 2 diabetes mellitus without complications: Secondary | ICD-10-CM

## 2024-09-09 ENCOUNTER — Other Ambulatory Visit: Payer: Self-pay | Admitting: Physician Assistant

## 2024-09-09 DIAGNOSIS — E119 Type 2 diabetes mellitus without complications: Secondary | ICD-10-CM

## 2024-09-14 ENCOUNTER — Ambulatory Visit: Admitting: Physician Assistant

## 2024-09-14 VITALS — BP 122/80 | HR 85 | Temp 98.2°F | Ht 64.0 in | Wt 292.8 lb

## 2024-09-14 DIAGNOSIS — F411 Generalized anxiety disorder: Secondary | ICD-10-CM

## 2024-09-14 DIAGNOSIS — E119 Type 2 diabetes mellitus without complications: Secondary | ICD-10-CM

## 2024-09-14 DIAGNOSIS — I152 Hypertension secondary to endocrine disorders: Secondary | ICD-10-CM | POA: Diagnosis not present

## 2024-09-14 DIAGNOSIS — E1159 Type 2 diabetes mellitus with other circulatory complications: Secondary | ICD-10-CM

## 2024-09-14 DIAGNOSIS — Z1211 Encounter for screening for malignant neoplasm of colon: Secondary | ICD-10-CM

## 2024-09-14 DIAGNOSIS — F5101 Primary insomnia: Secondary | ICD-10-CM

## 2024-09-14 DIAGNOSIS — F32A Depression, unspecified: Secondary | ICD-10-CM

## 2024-09-14 DIAGNOSIS — Z7985 Long-term (current) use of injectable non-insulin antidiabetic drugs: Secondary | ICD-10-CM | POA: Diagnosis not present

## 2024-09-14 DIAGNOSIS — F329 Major depressive disorder, single episode, unspecified: Secondary | ICD-10-CM

## 2024-09-14 LAB — HEMOGLOBIN A1C: HbA1c POC (<> result, manual entry): 5.9 %

## 2024-09-14 MED ORDER — HYDROXYZINE HCL 25 MG PO TABS: 25.0000 mg | ORAL_TABLET | Freq: Every evening | ORAL | 2 refills | Status: AC | PRN

## 2024-09-14 MED ORDER — ZOLPIDEM TARTRATE 10 MG PO TABS: 10.0000 mg | ORAL_TABLET | Freq: Every evening | ORAL | 2 refills | Status: AC | PRN

## 2024-09-14 NOTE — Progress Notes (Signed)
 "   Patient ID: LISETT DIRUSSO, female    DOB: Jan 19, 1981, 44 y.o.   MRN: [id number]   Assessment & Plan:  Type 2 diabetes mellitus without complication, without long-term current use of insulin  (HCC) -     Hemoglobin A1c  Hypertension associated with type 2 diabetes mellitus (HCC)  Anxiety and depression  Primary insomnia  Screening for colon cancer -     Ambulatory referral to Gastroenterology  Other orders -     Zolpidem  Tartrate; Take 1 tablet (10 mg total) by mouth at bedtime as needed for sleep.  Dispense: 30 tablet; Refill: 2 -     hydrOXYzine  HCl; Take 1 tablet (25 mg total) by mouth at bedtime as needed for anxiety (insomnia). Take one 25 mg tablet 30-60 minutes prior to bedtime for insomnia, anxiety. May increase to two tablets.  Dispense: 30 tablet; Refill: 2      Assessment and Plan Assessment & Plan Type 2 diabetes mellitus Well-controlled with an A1c of 5.9%. - Continue Ozempic  2 mg once a week. Lab Results  Component Value Date   HGBA1C 5.9 09/14/2024   HGBA1C 6.1 03/14/2024   HGBA1C 5.8 07/19/2023     Essential hypertension Well-controlled with current medication regimen. - Continue Benicar  and hydrochlorothiazide.  Major depressive disorder and generalized anxiety disorder Well-managed on current medication regimen. - Continue Prozac  20 mg daily.  Insomnia Chronic insomnia exacerbated by recent life stressors, including the death of her mother. Currently using Ambien  intermittently, but concerned about potential dependency. Previous use of Benadryl and trazodone  with limited success. Discussed potential use of hydroxyzine  as an alternative to Ambien  to reduce dependency risk. - Prescribed hydroxyzine  for insomnia, with instructions to take one tablet prior to bedtime, may increase to two tablets. - Continue Ambien  10 mg as needed, with 30 tablets and refills provided. - Encouraged exposure to sunlight during the day to help regulate circadian  rhythm. - Encouraged increased physical activity during the day to aid sleep.      Return in about 6 months (around 03/14/2025) for recheck/follow-up, physical, fasting labs .    Subjective:    Chief Complaint  Patient presents with   Medical Management of Chronic Issues    6 month follow up; pt wanting to discuss using Ambien  for sleep and needing it more on a long term basis versus short term, pt needs refill   Diabetes    Pt in office for diabetes follow up and A1C and urine; pt has no concerns to discuss    HPI Discussed the use of AI scribe software for clinical note transcription with the patient, who gave verbal consent to proceed.  History of Present Illness YARIELA TISON is a 44 year old female with type two diabetes who presents for a follow-up visit.  Her type two diabetes is well-controlled with an A1c of 5.9%, slightly increased from 5.8% previously. She is on Ozempic  2 mg once a week, which has been effective in maintaining glycemic control.  She has a history of hypertension, which is well-controlled with Benicar  and hydrochlorothiazide.  She experiences depression and anxiety, which are stable on 20 mg of Prozac  daily.  She has been experiencing sleep disturbances, particularly with falling asleep and waking up early. She was prescribed Ambien  temporarily in 09/01/2025 following her mother's death and finds it effective on the nights she uses it. She has a history of lifelong sleep issues and has tried Benadryl and delta-8 CBD in the past, with limited success.  Her mother passed away in December after battling dementia, and her father, who was helping with her mother's care, is still living. 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 Her daughter recently got her driver's license, which has reduced some of her caregiving responsibilities.  No palpitations.     Past  Medical History:  Diagnosis Date   Allergy    B12 deficiency    Depression    Diabetes mellitus without complication (HCC)    Phreesia 09/30/2020   Fatty liver    Hyperlipidemia 08/2020   Hypertension    Joint pain    PCOS (polycystic ovarian syndrome)    Vitamin D  deficiency     Past Surgical History:  Procedure Laterality Date   CESAREAN SECTION  02/2007   FOOT SURGERY  2000   bone removed from left heel     Family History  Problem Relation Age of Onset   Hyperlipidemia Mother    Hypertension Mother    Dementia Mother 6       early onset   Thyroid  disease Mother    Depression Mother    Obesity Mother    Hyperlipidemia Father    Hypertension Father    Cancer Maternal Grandmother        breast CA   Diabetes Maternal Grandmother    Breast cancer Maternal Grandmother 56   Hypertension Maternal Grandfather    Heart disease Maternal Grandfather    Hyperlipidemia Maternal Grandfather    Heart disease Paternal Grandmother    Hypertension Paternal Grandmother     Social History[1]   Allergies[2]  Review of Systems NEGATIVE UNLESS OTHERWISE INDICATED IN HPI      Objective:     BP 122/80 (BP Location: Right Arm, Patient Position: Sitting, Cuff Size: Normal)   Pulse 85   Temp 98.2 F (36.8 C) (Temporal)   Ht 5' 4 (1.626 m)   Wt 292 lb 12.8 oz (132.8 kg)   SpO2 98%   BMI 50.26 kg/m   Wt Readings from Last 3 Encounters:  09/14/24 292 lb 12.8 oz (132.8 kg)  05/24/24 285 lb 8 oz (129.5 kg)  03/14/24 285 lb 6.4 oz (129.5 kg)    BP Readings from Last 3 Encounters:  09/14/24 122/80  05/24/24 122/80  03/14/24 132/76     Physical Exam Vitals and nursing note reviewed.  Constitutional:      Appearance: Normal appearance. She is obese.  Eyes:     Extraocular Movements: Extraocular movements intact.     Conjunctiva/sclera: Conjunctivae normal.     Pupils: Pupils are equal, round, and reactive to light.  Cardiovascular:     Rate and Rhythm: Normal rate  and regular rhythm.     Pulses: Normal pulses.     Heart sounds: Normal heart sounds. No murmur heard. Pulmonary:     Effort: Pulmonary effort is normal.     Breath sounds: Normal breath sounds.  Neurological:     General: No focal deficit present.     Mental Status: She is alert and oriented to person, place, and time.  Psychiatric:        Mood and Affect: Mood normal.        Behavior: Behavior normal.             Daleyssa Loiselle M Amato Sevillano, PA-C     [1]  Social History Tobacco Use   Smoking status: Never   Smokeless tobacco: Never  Vaping Use   Vaping status: Never Used  Substance Use Topics   Alcohol use: Yes    Comment: 1-2 drink per week   Drug use: No  [2]  Allergies Allergen Reactions   Erythromycin     GI upset   Trulicity  [Dulaglutide ] Rash   "

## 2024-09-14 NOTE — Patient Instructions (Signed)
" °  VISIT SUMMARY: Today we reviewed your type 2 diabetes, hypertension, depression, anxiety, and sleep disturbances. Your diabetes and hypertension are well-controlled, and your mental health is stable. We discussed your ongoing sleep issues and made some adjustments to your treatment plan.  YOUR PLAN: TYPE 2 DIABETES MELLITUS: Your diabetes is well-controlled with an A1c of 5.9%. -Continue taking Ozempic  2 mg once a week.  ESSENTIAL HYPERTENSION: Your blood pressure is well-controlled with your current medications. -Continue taking Benicar  and hydrochlorothiazide as prescribed.  MAJOR DEPRESSIVE DISORDER AND GENERALIZED ANXIETY DISORDER: Your depression and anxiety are stable with your current medication. -Continue taking Prozac  20 mg daily.  INSOMNIA: You have chronic insomnia, which has been worse due to recent stress. You are currently using Ambien  but are concerned about dependency. -Start taking hydroxyzine  for insomnia. Take one tablet before bedtime, and you may increase to two tablets if needed. -Continue using Ambien  10 mg as needed, with 30 tablets and refills provided. -Try to get more sunlight during the day to help regulate your sleep cycle. -Increase your physical activity during the day to help improve your sleep.    Contains text generated by [employer].   "

## 2024-09-19 ENCOUNTER — Other Ambulatory Visit (HOSPITAL_COMMUNITY): Payer: Self-pay

## 2024-09-24 ENCOUNTER — Other Ambulatory Visit: Payer: Self-pay | Admitting: Physician Assistant

## 2024-09-24 DIAGNOSIS — F32A Depression, unspecified: Secondary | ICD-10-CM

## 2025-03-20 ENCOUNTER — Encounter: Admitting: Physician Assistant
